# Patient Record
Sex: Female | Born: 1935 | Race: White | Hispanic: No | Smoking: Former smoker
Health system: Southern US, Community
[De-identification: ages and names within clinical notes are randomized; demographics above are authoritative.]

## PROBLEM LIST (undated history)

## (undated) DIAGNOSIS — I509 Heart failure, unspecified: Secondary | ICD-10-CM

## (undated) DIAGNOSIS — I10 Essential (primary) hypertension: Secondary | ICD-10-CM

## (undated) DIAGNOSIS — J449 Chronic obstructive pulmonary disease, unspecified: Secondary | ICD-10-CM

## (undated) HISTORY — PX: CORONARY ARTERY BYPASS GRAFT: SHX141

---

## 1999-11-07 ENCOUNTER — Inpatient Hospital Stay (HOSPITAL_COMMUNITY): Admission: EM | Admit: 1999-11-07 | Discharge: 1999-11-16 | Payer: Self-pay | Admitting: Cardiology

## 1999-11-08 ENCOUNTER — Encounter: Payer: Self-pay | Admitting: Surgery

## 1999-11-09 ENCOUNTER — Encounter: Payer: Self-pay | Admitting: Surgery

## 1999-11-10 ENCOUNTER — Encounter: Payer: Self-pay | Admitting: Surgery

## 1999-11-11 ENCOUNTER — Encounter: Payer: Self-pay | Admitting: Surgery

## 2003-01-05 ENCOUNTER — Ambulatory Visit (HOSPITAL_COMMUNITY): Admission: RE | Admit: 2003-01-05 | Discharge: 2003-01-05 | Payer: Self-pay | Admitting: Family Medicine

## 2003-01-05 ENCOUNTER — Encounter: Payer: Self-pay | Admitting: Family Medicine

## 2014-12-02 DIAGNOSIS — I2510 Atherosclerotic heart disease of native coronary artery without angina pectoris: Secondary | ICD-10-CM | POA: Diagnosis not present

## 2014-12-02 DIAGNOSIS — E039 Hypothyroidism, unspecified: Secondary | ICD-10-CM | POA: Diagnosis not present

## 2014-12-02 DIAGNOSIS — Z1389 Encounter for screening for other disorder: Secondary | ICD-10-CM | POA: Diagnosis not present

## 2014-12-02 DIAGNOSIS — Z0001 Encounter for general adult medical examination with abnormal findings: Secondary | ICD-10-CM | POA: Diagnosis not present

## 2014-12-02 DIAGNOSIS — H6122 Impacted cerumen, left ear: Secondary | ICD-10-CM | POA: Diagnosis not present

## 2014-12-02 DIAGNOSIS — N182 Chronic kidney disease, stage 2 (mild): Secondary | ICD-10-CM | POA: Diagnosis not present

## 2015-11-23 DIAGNOSIS — Z23 Encounter for immunization: Secondary | ICD-10-CM | POA: Diagnosis not present

## 2015-11-23 DIAGNOSIS — I2510 Atherosclerotic heart disease of native coronary artery without angina pectoris: Secondary | ICD-10-CM | POA: Diagnosis not present

## 2015-11-23 DIAGNOSIS — R7309 Other abnormal glucose: Secondary | ICD-10-CM | POA: Diagnosis not present

## 2015-11-23 DIAGNOSIS — I10 Essential (primary) hypertension: Secondary | ICD-10-CM | POA: Diagnosis not present

## 2015-11-23 DIAGNOSIS — Z1389 Encounter for screening for other disorder: Secondary | ICD-10-CM | POA: Diagnosis not present

## 2015-11-23 DIAGNOSIS — E782 Mixed hyperlipidemia: Secondary | ICD-10-CM | POA: Diagnosis not present

## 2015-11-23 DIAGNOSIS — E039 Hypothyroidism, unspecified: Secondary | ICD-10-CM | POA: Diagnosis not present

## 2016-11-21 DIAGNOSIS — I10 Essential (primary) hypertension: Secondary | ICD-10-CM | POA: Diagnosis not present

## 2016-11-21 DIAGNOSIS — E039 Hypothyroidism, unspecified: Secondary | ICD-10-CM | POA: Diagnosis not present

## 2016-11-21 DIAGNOSIS — E782 Mixed hyperlipidemia: Secondary | ICD-10-CM | POA: Diagnosis not present

## 2016-11-21 DIAGNOSIS — E063 Autoimmune thyroiditis: Secondary | ICD-10-CM | POA: Diagnosis not present

## 2016-11-21 DIAGNOSIS — Z1389 Encounter for screening for other disorder: Secondary | ICD-10-CM | POA: Diagnosis not present

## 2017-11-13 DIAGNOSIS — Z1389 Encounter for screening for other disorder: Secondary | ICD-10-CM | POA: Diagnosis not present

## 2017-11-13 DIAGNOSIS — Z0001 Encounter for general adult medical examination with abnormal findings: Secondary | ICD-10-CM | POA: Diagnosis not present

## 2017-11-13 DIAGNOSIS — E782 Mixed hyperlipidemia: Secondary | ICD-10-CM | POA: Diagnosis not present

## 2017-11-13 DIAGNOSIS — I10 Essential (primary) hypertension: Secondary | ICD-10-CM | POA: Diagnosis not present

## 2017-11-13 DIAGNOSIS — E441 Mild protein-calorie malnutrition: Secondary | ICD-10-CM | POA: Diagnosis not present

## 2018-10-15 DIAGNOSIS — Z1389 Encounter for screening for other disorder: Secondary | ICD-10-CM | POA: Diagnosis not present

## 2018-10-15 DIAGNOSIS — N182 Chronic kidney disease, stage 2 (mild): Secondary | ICD-10-CM | POA: Diagnosis not present

## 2018-10-15 DIAGNOSIS — E7849 Other hyperlipidemia: Secondary | ICD-10-CM | POA: Diagnosis not present

## 2018-10-15 DIAGNOSIS — Z0001 Encounter for general adult medical examination with abnormal findings: Secondary | ICD-10-CM | POA: Diagnosis not present

## 2018-10-15 DIAGNOSIS — I10 Essential (primary) hypertension: Secondary | ICD-10-CM | POA: Diagnosis not present

## 2018-10-15 DIAGNOSIS — E039 Hypothyroidism, unspecified: Secondary | ICD-10-CM | POA: Diagnosis not present

## 2019-10-13 DIAGNOSIS — E7849 Other hyperlipidemia: Secondary | ICD-10-CM | POA: Diagnosis not present

## 2019-10-13 DIAGNOSIS — I10 Essential (primary) hypertension: Secondary | ICD-10-CM | POA: Diagnosis not present

## 2019-10-13 DIAGNOSIS — Z0001 Encounter for general adult medical examination with abnormal findings: Secondary | ICD-10-CM | POA: Diagnosis not present

## 2019-10-13 DIAGNOSIS — E039 Hypothyroidism, unspecified: Secondary | ICD-10-CM | POA: Diagnosis not present

## 2019-10-13 DIAGNOSIS — R0602 Shortness of breath: Secondary | ICD-10-CM | POA: Diagnosis not present

## 2019-10-13 DIAGNOSIS — Z0000 Encounter for general adult medical examination without abnormal findings: Secondary | ICD-10-CM | POA: Diagnosis not present

## 2019-10-13 DIAGNOSIS — Z1389 Encounter for screening for other disorder: Secondary | ICD-10-CM | POA: Diagnosis not present

## 2019-10-18 ENCOUNTER — Inpatient Hospital Stay (HOSPITAL_COMMUNITY)
Admission: EM | Admit: 2019-10-18 | Discharge: 2019-10-25 | DRG: 291 | Disposition: A | Discharge status: Home / Self Care | Payer: Medicare Other | Attending: Internal Medicine | Admitting: Internal Medicine

## 2019-10-18 ENCOUNTER — Other Ambulatory Visit: Payer: Self-pay

## 2019-10-18 ENCOUNTER — Emergency Department (HOSPITAL_COMMUNITY): Payer: Medicare Other

## 2019-10-18 ENCOUNTER — Encounter (HOSPITAL_COMMUNITY): Payer: Self-pay | Admitting: Emergency Medicine

## 2019-10-18 DIAGNOSIS — N281 Cyst of kidney, acquired: Secondary | ICD-10-CM | POA: Diagnosis present

## 2019-10-18 DIAGNOSIS — J9811 Atelectasis: Secondary | ICD-10-CM | POA: Diagnosis not present

## 2019-10-18 DIAGNOSIS — G9341 Metabolic encephalopathy: Secondary | ICD-10-CM | POA: Diagnosis not present

## 2019-10-18 DIAGNOSIS — R54 Age-related physical debility: Secondary | ICD-10-CM | POA: Diagnosis not present

## 2019-10-18 DIAGNOSIS — E1165 Type 2 diabetes mellitus with hyperglycemia: Secondary | ICD-10-CM | POA: Diagnosis not present

## 2019-10-18 DIAGNOSIS — R059 Cough, unspecified: Secondary | ICD-10-CM

## 2019-10-18 DIAGNOSIS — R0902 Hypoxemia: Secondary | ICD-10-CM | POA: Diagnosis not present

## 2019-10-18 DIAGNOSIS — I248 Other forms of acute ischemic heart disease: Secondary | ICD-10-CM | POA: Diagnosis present

## 2019-10-18 DIAGNOSIS — R0602 Shortness of breath: Secondary | ICD-10-CM | POA: Diagnosis not present

## 2019-10-18 DIAGNOSIS — R1310 Dysphagia, unspecified: Secondary | ICD-10-CM | POA: Diagnosis present

## 2019-10-18 DIAGNOSIS — J441 Chronic obstructive pulmonary disease with (acute) exacerbation: Secondary | ICD-10-CM | POA: Diagnosis not present

## 2019-10-18 DIAGNOSIS — R05 Cough: Secondary | ICD-10-CM

## 2019-10-18 DIAGNOSIS — I700 Atherosclerosis of aorta: Secondary | ICD-10-CM | POA: Diagnosis not present

## 2019-10-18 DIAGNOSIS — R739 Hyperglycemia, unspecified: Secondary | ICD-10-CM | POA: Diagnosis not present

## 2019-10-18 DIAGNOSIS — R5381 Other malaise: Secondary | ICD-10-CM | POA: Diagnosis present

## 2019-10-18 DIAGNOSIS — Z743 Need for continuous supervision: Secondary | ICD-10-CM | POA: Diagnosis not present

## 2019-10-18 DIAGNOSIS — I110 Hypertensive heart disease with heart failure: Secondary | ICD-10-CM | POA: Diagnosis not present

## 2019-10-18 DIAGNOSIS — Z79890 Hormone replacement therapy (postmenopausal): Secondary | ICD-10-CM | POA: Diagnosis not present

## 2019-10-18 DIAGNOSIS — E039 Hypothyroidism, unspecified: Secondary | ICD-10-CM | POA: Diagnosis present

## 2019-10-18 DIAGNOSIS — Z951 Presence of aortocoronary bypass graft: Secondary | ICD-10-CM

## 2019-10-18 DIAGNOSIS — Z87891 Personal history of nicotine dependence: Secondary | ICD-10-CM

## 2019-10-18 DIAGNOSIS — R069 Unspecified abnormalities of breathing: Secondary | ICD-10-CM | POA: Diagnosis not present

## 2019-10-18 DIAGNOSIS — I5043 Acute on chronic combined systolic (congestive) and diastolic (congestive) heart failure: Secondary | ICD-10-CM | POA: Diagnosis not present

## 2019-10-18 DIAGNOSIS — I5031 Acute diastolic (congestive) heart failure: Secondary | ICD-10-CM | POA: Diagnosis not present

## 2019-10-18 DIAGNOSIS — I509 Heart failure, unspecified: Secondary | ICD-10-CM | POA: Diagnosis not present

## 2019-10-18 DIAGNOSIS — I959 Hypotension, unspecified: Secondary | ICD-10-CM | POA: Diagnosis not present

## 2019-10-18 DIAGNOSIS — Z8249 Family history of ischemic heart disease and other diseases of the circulatory system: Secondary | ICD-10-CM | POA: Diagnosis not present

## 2019-10-18 DIAGNOSIS — E7800 Pure hypercholesterolemia, unspecified: Secondary | ICD-10-CM | POA: Diagnosis not present

## 2019-10-18 DIAGNOSIS — I10 Essential (primary) hypertension: Secondary | ICD-10-CM | POA: Diagnosis not present

## 2019-10-18 DIAGNOSIS — I5041 Acute combined systolic (congestive) and diastolic (congestive) heart failure: Secondary | ICD-10-CM | POA: Diagnosis not present

## 2019-10-18 DIAGNOSIS — E785 Hyperlipidemia, unspecified: Secondary | ICD-10-CM | POA: Diagnosis present

## 2019-10-18 DIAGNOSIS — J9601 Acute respiratory failure with hypoxia: Secondary | ICD-10-CM | POA: Diagnosis not present

## 2019-10-18 DIAGNOSIS — Z880 Allergy status to penicillin: Secondary | ICD-10-CM

## 2019-10-18 DIAGNOSIS — Z20822 Contact with and (suspected) exposure to covid-19: Secondary | ICD-10-CM | POA: Diagnosis not present

## 2019-10-18 DIAGNOSIS — R7303 Prediabetes: Secondary | ICD-10-CM | POA: Diagnosis not present

## 2019-10-18 DIAGNOSIS — R079 Chest pain, unspecified: Secondary | ICD-10-CM | POA: Diagnosis not present

## 2019-10-18 DIAGNOSIS — I2510 Atherosclerotic heart disease of native coronary artery without angina pectoris: Secondary | ICD-10-CM | POA: Diagnosis not present

## 2019-10-18 DIAGNOSIS — R531 Weakness: Secondary | ICD-10-CM | POA: Diagnosis not present

## 2019-10-18 DIAGNOSIS — J9600 Acute respiratory failure, unspecified whether with hypoxia or hypercapnia: Secondary | ICD-10-CM | POA: Diagnosis present

## 2019-10-18 DIAGNOSIS — Z79899 Other long term (current) drug therapy: Secondary | ICD-10-CM

## 2019-10-18 DIAGNOSIS — I340 Nonrheumatic mitral (valve) insufficiency: Secondary | ICD-10-CM | POA: Diagnosis not present

## 2019-10-18 HISTORY — DX: Essential (primary) hypertension: I10

## 2019-10-18 HISTORY — DX: Chronic obstructive pulmonary disease, unspecified: J44.9

## 2019-10-18 HISTORY — DX: Heart failure, unspecified: I50.9

## 2019-10-18 LAB — CBC WITH DIFFERENTIAL/PLATELET
Abs Immature Granulocytes: 0.07 10*3/uL (ref 0.00–0.07)
Basophils Absolute: 0 10*3/uL (ref 0.0–0.1)
Basophils Relative: 0 %
Eosinophils Absolute: 0.4 10*3/uL (ref 0.0–0.5)
Eosinophils Relative: 3 %
HCT: 44.5 % (ref 36.0–46.0)
Hemoglobin: 14 g/dL (ref 12.0–15.0)
Immature Granulocytes: 1 %
Lymphocytes Relative: 3 %
Lymphs Abs: 0.5 10*3/uL — ABNORMAL LOW (ref 0.7–4.0)
MCH: 33.7 pg (ref 26.0–34.0)
MCHC: 31.5 g/dL (ref 30.0–36.0)
MCV: 107 fL — ABNORMAL HIGH (ref 80.0–100.0)
Monocytes Absolute: 1 10*3/uL (ref 0.1–1.0)
Monocytes Relative: 6 %
Neutro Abs: 12.9 10*3/uL — ABNORMAL HIGH (ref 1.7–7.7)
Neutrophils Relative %: 87 %
Platelets: 165 10*3/uL (ref 150–400)
RBC: 4.16 MIL/uL (ref 3.87–5.11)
RDW: 13.4 % (ref 11.5–15.5)
WBC: 14.9 10*3/uL — ABNORMAL HIGH (ref 4.0–10.5)
nRBC: 0 % (ref 0.0–0.2)

## 2019-10-18 LAB — URINALYSIS, ROUTINE W REFLEX MICROSCOPIC
Bacteria, UA: NONE SEEN
Bilirubin Urine: NEGATIVE
Glucose, UA: 150 mg/dL — AB
Ketones, ur: NEGATIVE mg/dL
Leukocytes,Ua: NEGATIVE
Nitrite: NEGATIVE
Protein, ur: 100 mg/dL — AB
Specific Gravity, Urine: 1.012 (ref 1.005–1.030)
pH: 5 (ref 5.0–8.0)

## 2019-10-18 LAB — TROPONIN I (HIGH SENSITIVITY)
Troponin I (High Sensitivity): 43 ng/L — ABNORMAL HIGH (ref <18)
Troponin I (High Sensitivity): 118 ng/L (ref <18)
Troponin I (High Sensitivity): 219 ng/L (ref <18)
Troponin I (High Sensitivity): 331 ng/L (ref <18)
Troponin I (High Sensitivity): 410 ng/L (ref <18)

## 2019-10-18 LAB — COMPREHENSIVE METABOLIC PANEL
ALT: 28 U/L (ref 0–44)
AST: 36 U/L (ref 15–41)
Albumin: 4.2 g/dL (ref 3.5–5.0)
Alkaline Phosphatase: 85 U/L (ref 38–126)
Anion gap: 11 (ref 5–15)
BUN: 14 mg/dL (ref 8–23)
CO2: 26 mmol/L (ref 22–32)
Calcium: 8.3 mg/dL — ABNORMAL LOW (ref 8.9–10.3)
Chloride: 99 mmol/L (ref 98–111)
Creatinine, Ser: 0.86 mg/dL (ref 0.44–1.00)
GFR calc Af Amer: >60 mL/min (ref >60)
GFR calc non Af Amer: >60 mL/min (ref >60)
Glucose, Bld: 213 mg/dL — ABNORMAL HIGH (ref 70–99)
Potassium: 4.1 mmol/L (ref 3.5–5.1)
Sodium: 136 mmol/L (ref 135–145)
Total Bilirubin: 0.8 mg/dL (ref 0.3–1.2)
Total Protein: 6.8 g/dL (ref 6.5–8.1)

## 2019-10-18 LAB — RESPIRATORY PANEL BY RT PCR (FLU A&B, COVID)
Influenza A by PCR: NEGATIVE
Influenza B by PCR: NEGATIVE
SARS Coronavirus 2 by RT PCR: NEGATIVE

## 2019-10-18 LAB — BRAIN NATRIURETIC PEPTIDE: B Natriuretic Peptide: 1792 pg/mL — ABNORMAL HIGH (ref 0.0–100.0)

## 2019-10-18 LAB — TSH: TSH: 2.079 u[IU]/mL (ref 0.350–4.500)

## 2019-10-18 LAB — BLOOD GAS, ARTERIAL
Acid-Base Excess: 2.3 mmol/L — ABNORMAL HIGH (ref 0.0–2.0)
Bicarbonate: 26 mmol/L (ref 20.0–28.0)
FIO2: 28
O2 Saturation: 94.9 %
Patient temperature: 36.4
pCO2 arterial: 45.4 mmHg (ref 32.0–48.0)
pH, Arterial: 7.389 (ref 7.350–7.450)
pO2, Arterial: 78.2 mmHg — ABNORMAL LOW (ref 83.0–108.0)

## 2019-10-18 MED ORDER — ACETAMINOPHEN 325 MG PO TABS: 650.0000 mg | ORAL_TABLET | Freq: Four times a day (QID) | ORAL | Status: DC | PRN

## 2019-10-18 MED ORDER — BENAZEPRIL HCL 20 MG PO TABS
20.0000 mg | ORAL_TABLET | Freq: Every day | ORAL | Status: DC
  Administered 2019-10-19 – 2019-10-20 (×2): 20 mg via ORAL
  Filled 2019-10-18 (×2): qty 1

## 2019-10-18 MED ORDER — POLYETHYLENE GLYCOL 3350 17 G PO PACK: 17.0000 g | PACK | Freq: Every day | ORAL | Status: DC | PRN

## 2019-10-18 MED ORDER — BENAZEPRIL HCL 10 MG PO TABS
20.0000 mg | ORAL_TABLET | Freq: Every day | ORAL | Status: DC
  Administered 2019-10-18: 20 mg via ORAL
  Filled 2019-10-18 (×3): qty 2

## 2019-10-18 MED ORDER — ENOXAPARIN SODIUM 40 MG/0.4ML ~~LOC~~ SOLN: 40.0000 mg | SUBCUTANEOUS | Status: DC

## 2019-10-18 MED ORDER — ASPIRIN EC 81 MG PO TBEC
81.0000 mg | DELAYED_RELEASE_TABLET | Freq: Every day | ORAL | Status: DC
  Administered 2019-10-19 – 2019-10-25 (×7): 81 mg via ORAL
  Filled 2019-10-18 (×7): qty 1

## 2019-10-18 MED ORDER — FUROSEMIDE 10 MG/ML IJ SOLN
40.0000 mg | Freq: Once | INTRAMUSCULAR | Status: AC
  Administered 2019-10-18: 40 mg via INTRAVENOUS
  Filled 2019-10-18: qty 4

## 2019-10-18 MED ORDER — SODIUM CHLORIDE 0.9 % IV SOLN
3.0000 g | Freq: Three times a day (TID) | INTRAVENOUS | Status: DC
  Administered 2019-10-18 – 2019-10-19 (×3): 3 g via INTRAVENOUS
  Filled 2019-10-18: qty 8
  Filled 2019-10-18: qty 3
  Filled 2019-10-18 (×2): qty 8

## 2019-10-18 MED ORDER — ACETAMINOPHEN 650 MG RE SUPP: 650.0000 mg | Freq: Four times a day (QID) | RECTAL | Status: DC | PRN

## 2019-10-18 MED ORDER — METOPROLOL TARTRATE 25 MG PO TABS
25.0000 mg | ORAL_TABLET | Freq: Once | ORAL | Status: AC
  Administered 2019-10-18: 25 mg via ORAL
  Filled 2019-10-18: qty 1

## 2019-10-18 MED ORDER — LABETALOL HCL 5 MG/ML IV SOLN: 5.0000 mg | INTRAVENOUS | Status: DC | PRN

## 2019-10-18 MED ORDER — ENOXAPARIN SODIUM 30 MG/0.3ML ~~LOC~~ SOLN
30.0000 mg | SUBCUTANEOUS | Status: DC
  Administered 2019-10-18 – 2019-10-24 (×7): 30 mg via SUBCUTANEOUS
  Filled 2019-10-18 (×7): qty 0.3

## 2019-10-18 MED ORDER — ONDANSETRON HCL 4 MG PO TABS: 4.0000 mg | ORAL_TABLET | Freq: Four times a day (QID) | ORAL | Status: DC | PRN

## 2019-10-18 MED ORDER — ONDANSETRON HCL 4 MG/2ML IJ SOLN: 4.0000 mg | Freq: Four times a day (QID) | INTRAMUSCULAR | Status: DC | PRN

## 2019-10-18 MED ORDER — IPRATROPIUM-ALBUTEROL 0.5-2.5 (3) MG/3ML IN SOLN
3.0000 mL | Freq: Four times a day (QID) | RESPIRATORY_TRACT | Status: AC
  Administered 2019-10-18: 3 mL via RESPIRATORY_TRACT
  Filled 2019-10-18: qty 3

## 2019-10-18 MED ORDER — METOPROLOL TARTRATE 25 MG PO TABS
25.0000 mg | ORAL_TABLET | Freq: Two times a day (BID) | ORAL | Status: DC
  Administered 2019-10-19 – 2019-10-20 (×3): 25 mg via ORAL
  Filled 2019-10-18 (×3): qty 1

## 2019-10-18 MED ORDER — IOHEXOL 350 MG/ML SOLN
100.0000 mL | Freq: Once | INTRAVENOUS | Status: AC | PRN
  Administered 2019-10-18: 100 mL via INTRAVENOUS

## 2019-10-18 MED ORDER — ASPIRIN 81 MG PO CHEW
324.0000 mg | CHEWABLE_TABLET | Freq: Once | ORAL | Status: AC
  Administered 2019-10-18: 324 mg via ORAL
  Filled 2019-10-18: qty 4

## 2019-10-18 NOTE — ED Provider Notes (Signed)
[facility] EMERGENCY DEPARTMENT Provider Note   CSN: [id number] Arrival date & time: 10/18/19  1007     History Chief Complaint  Patient presents with  . Shortness of Breath    Chelsea Goodman is a 86 y.o. female.  Patient with acute onset of difficulty breathing this morning is very short of breath at home.  EMS noted that room air sats were 84%.  Patient also seemed a little confused.  EMS placed her on nonrebreather and oxygen levels went up to 100%.  Blood sugars were 384 at home.  Patient's past medical history she used to be a smoker not known to have COPD known to have hypertension.  Has had coronary artery bypass graft surgery in the past.  Not known to have congestive heart failure.  No leg swelling.  Patient has had both Covid vaccines.  Had the second 1 way back in early February.  Patient denies any chest pain.  Patient switched over to 2 L nasal cannula oxygen here.  Oxygen sats in the low 90s and patient comfortable.        Past Medical History:  Diagnosis Date  . Hypertension     There are no problems to display for this patient.   Past Surgical History:  Procedure Laterality Date  . CORONARY ARTERY BYPASS GRAFT       OB History   No obstetric history on file.     No family history on file.  Social History   Tobacco Use  . Smoking status: Former Games developer  . Smokeless tobacco: Never Used  Substance Use Topics  . Alcohol use: Not Currently  . Drug use: Not Currently    Home Medications Prior to Admission medications   Medication Sig Start Date End Date Taking? Authorizing Provider  benazepril (LOTENSIN) 20 MG tablet Take 20 mg by mouth daily. 10/13/19   [provider]  levothyroxine (SYNTHROID) 75 MCG tablet Take 75 mcg by mouth daily. 08/23/19   [provider]  metoprolol tartrate (LOPRESSOR) 25 MG tablet Take 25 mg by mouth 2 (two) times daily. 08/09/19   [provider]  simvastatin (ZOCOR) 20 MG tablet Take 20 mg by  mouth daily. 10/13/19   [provider]    Allergies    Patient has no known allergies.  Review of Systems   Review of Systems  Constitutional: Negative for chills and fever.  HENT: Negative for congestion, rhinorrhea and sore throat.   Eyes: Negative for visual disturbance.  Respiratory: Positive for shortness of breath. Negative for cough.   Cardiovascular: Negative for chest pain and leg swelling.  Gastrointestinal: Negative for abdominal pain, diarrhea, nausea and vomiting.  Genitourinary: Negative for dysuria.  Musculoskeletal: Negative for back pain and neck pain.  Skin: Negative for rash.  Neurological: Negative for dizziness, light-headedness and headaches.  Hematological: Does not bruise/bleed easily.  Psychiatric/Behavioral: Negative for confusion.    Physical Exam Updated Vital Signs BP 136/76   Pulse 72   Temp 97.6 F (36.4 C) (Oral)   Resp (!) 29   Ht 1.524 m (5')   Wt 52.2 kg   SpO2 97%   BMI 22.46 kg/m   Physical Exam Vitals and nursing note reviewed.  Constitutional:      General: She is not in acute distress.    Appearance: Normal appearance. She is well-developed.  HENT:     Head: Normocephalic and atraumatic.  Eyes:     Extraocular Movements: Extraocular movements intact.  Conjunctiva/sclera: Conjunctivae normal.     Pupils: Pupils are equal, round, and reactive to light.  Cardiovascular:     Rate and Rhythm: Normal rate and regular rhythm.     Heart sounds: No murmur.  Pulmonary:     Effort: Pulmonary effort is normal. No respiratory distress.     Breath sounds: Normal breath sounds. No wheezing.  Abdominal:     Palpations: Abdomen is soft.     Tenderness: There is no abdominal tenderness.  Musculoskeletal:        General: No swelling. Normal range of motion.     Cervical back: Normal range of motion and neck supple.  Skin:    General: Skin is warm and dry.     Capillary Refill: Capillary refill takes less than 2 seconds.   Neurological:     General: No focal deficit present.     Mental Status: She is alert and oriented to person, place, and time.     ED Results / Procedures / Treatments   Labs (all labs ordered are listed, but only abnormal results are displayed) Labs Reviewed  COMPREHENSIVE METABOLIC PANEL - Abnormal; Notable for the following components:      Result Value   Glucose, Bld 213 (*)    Calcium 8.3 (*)    All other components within normal limits  CBC WITH DIFFERENTIAL/PLATELET - Abnormal; Notable for the following components:   WBC 14.9 (*)    MCV 107.0 (*)    Neutro Abs 12.9 (*)    Lymphs Abs 0.5 (*)    All other components within normal limits  BRAIN NATRIURETIC PEPTIDE - Abnormal; Notable for the following components:   B Natriuretic Peptide 1,792.0 (*)    All other components within normal limits  URINALYSIS, ROUTINE W REFLEX MICROSCOPIC - Abnormal; Notable for the following components:   Glucose, UA 150 (*)    Hgb urine dipstick MODERATE (*)    Protein, ur 100 (*)    All other components within normal limits  BLOOD GAS, ARTERIAL - Abnormal; Notable for the following components:   pO2, Arterial 78.2 (*)    Acid-Base Excess 2.3 (*)    Allens test (pass/fail) RIGHT BRACHIAL (*)    All other components within normal limits  TROPONIN I (HIGH SENSITIVITY) - Abnormal; Notable for the following components:   Troponin I (High Sensitivity) 43 (*)    All other components within normal limits  TROPONIN I (HIGH SENSITIVITY) - Abnormal; Notable for the following components:   Troponin I (High Sensitivity) 118 (*)    All other components within normal limits  RESPIRATORY PANEL BY RT PCR (FLU A&B, COVID)  TROPONIN I (HIGH SENSITIVITY)    EKG EKG Interpretation  Date/Time:  Saturday Oct 18 2019 10:25:20 EDT Ventricular Rate:  90 PR Interval:    QRS Duration: 114 QT Interval:  371 QTC Calculation: 457 R Axis:   15 Text Interpretation: Sinus rhythm Borderline prolonged PR  interval LVH with IVCD and secondary repol abnrm Borderline ST elevation, inferior leads Interpretation limited secondary to artifact Confirmed by Vanetta Mulders [phone]) on 10/18/2019 10:40:39 AM   Radiology DG Chest Port 1 View  Result Date: 10/18/2019 CLINICAL DATA:  Shortness of breath EXAM: PORTABLE CHEST 1 VIEW COMPARISON:  None. FINDINGS: Probable background COPD with likely chronic interstitial prominence. Small left pleural effusion with left basilar atelectasis. Cardiomediastinal contours are within normal limits normal heart size and evidence of CABG. There is calcified plaque along the aortic arch. IMPRESSION: Probable COPD with  likely chronic interstitial prominence. Superimposed acute interstitial process is difficult to exclude. Small left pleural effusion with left basilar atelectasis. Electronically Signed   By: Macy Mis M.D.   On: 10/18/2019 11:47    Procedures Procedures (including critical care time)  CRITICAL CARE Performed by: Fredia Sorrow Total critical care time: 35 minutes Critical care time was exclusive of separately billable procedures and treating other patients. Critical care was necessary to treat or prevent imminent or life-threatening deterioration. Critical care was time spent personally by me on the following activities: development of treatment plan with patient and/or surrogate as well as nursing, discussions with consultants, evaluation of patient's response to treatment, examination of patient, obtaining history from patient or surrogate, ordering and performing treatments and interventions, ordering and review of laboratory studies, ordering and review of radiographic studies, pulse oximetry and re-evaluation of patient's condition.   Medications Ordered in ED Medications  furosemide (LASIX) injection 40 mg (40 mg Intravenous Given 10/18/19 1307)  iohexol (OMNIPAQUE) 350 MG/ML injection 100 mL (100 mLs Intravenous Contrast Given 10/18/19 1431)     ED Course  I have reviewed the triage vital signs and the nursing notes.  Pertinent labs & imaging results that were available during my care of the patient were reviewed by me and considered in my medical decision making (see chart for details).    MDM Rules/Calculators/A&P                     Work-up here approached on possible COPD possible cardiac cause for the shortness of breath.  Patient with new onset oxygen requirement.  Blood gas without a significant abnormality in pH or PCO2.  Patient's initial troponin was 43.  Repeat was 118.  This prompted discussion with cardiology.  Chest x-ray without necessarily any acute findings.  But patient's BNP was markedly elevated at 1700.  Cardiology recommends admission down to [facility] where they can consult.  By the hospitalist service.  Patient without any chest pain.  CT angio was ordered just for completeness sake when we are trying to sort out the hypoxia.  But results are not back yet.  Cardiology did not recommend heparin based on the troponins that we have so far.  Patient's family informed.  Patient was given 40 mg of Lasix IV.  Also gave her normal daily blood pressure medicine.  Which included Lotensin and Lopressor.  Because she had not had any of that today.  Patient given baby aspirin.  Covid test was negative.  Final Clinical Impression(s) / ED Diagnoses Final diagnoses:  Hypoxia  SOB (shortness of breath)  Acute congestive heart failure, unspecified heart failure type [facility])    Rx / DC Orders ED Discharge Orders    None       Fredia Sorrow, MD 10/18/19 1529

## 2019-10-18 NOTE — H&P (Addendum)
History and Physical    Chelsea Goodman MVH:[id number] DOB: 1934-03-18 DOA: 10/18/2019  PCP: Patient, No Pcp Per   Patient coming from: Home  I have personally briefly reviewed patient's old medical records in [facility]  Chief Complaint: Difficulty breathing.  HPI: Chelsea Goodman is a 86 y.o. female with medical history significant for CABG, hypertension.  Brought to the ED via EMS for reports of difficulty breathing, on EMS arrival patient was walking around the house confused, O2 sats 84% on room air.  Was placed on nasal cannula and O2 sats improved to 100%. At the time of my evaluation patient is awake alert and oriented.  She does not remember the episode of confusion. She has been having difficulty breathing ongoing over the past week. Patient has a remote history of smoking, over 40 years ago.  But she over the past 20 years she has been using a wood stove at home.  Over the past month the [employer] has being malfunctioning, patient's family has noticed the house smelling more of smoke, but this significantly worsened over the past 2 weeks, with smoke lingering in the air.  Patient's daughter had rattling in patient's chest over the past 2 weeks.  Patient is unaware of rattling or wheezing.  She has a chronic unchanged cough. No chest pain, no leg swelling, no weight gain, no bloating. Patient reports recently she has been having some difficulty swallowing especially her pills.  ED Course: Blood pressure elevated initially 200 systolic, improved to 130s with resuming home antihypertensives, temperature 97.2, tachypneic.  WBC 14.9.  BNP markedly elevated at 1792.  Troponin 43 >> 118.  UA moderate hemoglobin not suggestive of UTI.  pH 7.38, PCO2 45, PO2 78. Chest x-ray shows chronic findings suggestive of COPD.  CTA chest shows small pleural effusions, basilar atelectasis.  Small amount of aspirate material in bilateral proximal bronchi. EDP talked to cardiology recommended admission to  [facility], so that cardiology can see in consult.  IV Lasix 40 mg x 1 given.  Hospitalist admit for acute respiratory failure.  Review of Systems: As per HPI all other systems reviewed and negative.  Past Medical History:  Diagnosis Date  . Hypertension     Past Surgical History:  Procedure Laterality Date  . CORONARY ARTERY BYPASS GRAFT       reports that she has quit smoking. She has never used smokeless tobacco. She reports previous alcohol use. She reports previous drug use.  No Known Allergies  Family history of obesity.  Prior to Admission medications   Medication Sig Start Date End Date Taking? Authorizing Provider  benazepril (LOTENSIN) 20 MG tablet Take 20 mg by mouth daily. 10/13/19   [provider]  levothyroxine (SYNTHROID) 75 MCG tablet Take 75 mcg by mouth daily. 08/23/19   [provider]  metoprolol tartrate (LOPRESSOR) 25 MG tablet Take 25 mg by mouth 2 (two) times daily. 08/09/19   [provider]  simvastatin (ZOCOR) 20 MG tablet Take 20 mg by mouth daily. 10/13/19   [provider]    Physical Exam: Vitals:   10/18/19 1245 10/18/19 1300 10/18/19 1315 10/18/19 1330  BP:  (!) 152/67  136/76  Pulse: 80 71 79 72  Resp: (!) 24 (!) 28 (!) 31 (!) 29  Temp:      TempSrc:      SpO2: 94% 95% 94% 97%  Weight:      Height:        Constitutional: NAD, calm,  comfortable Vitals:   10/18/19 1245 10/18/19 1300 10/18/19 1315 10/18/19 1330  BP:  (!) 152/67  136/76  Pulse: 80 71 79 72  Resp: (!) 24 (!) 28 (!) 31 (!) 29  Temp:      TempSrc:      SpO2: 94% 95% 94% 97%  Weight:      Height:       Eyes: PERRL, lids and conjunctivae normal ENMT: Mucous membranes are dry. Posterior pharynx clear of any exudate or lesions.  Neck: normal, supple, no masses, no thyromegaly Respiratory: Coarse breath sounds, R worse than left, no wheezing, faint bibasilar crackles. Normal respiratory effort. No accessory muscle use.  Cardiovascular:  Regular rate and rhythm, no murmurs / rubs / gallops. No extremity edema. 2+ pedal pulses.   Abdomen: no tenderness, no masses palpated. No hepatosplenomegaly. Musculoskeletal: no clubbing / cyanosis. No joint deformity upper and lower extremities. Good ROM, no contractures. Normal muscle tone.  Skin: no rashes, lesions, ulcers. No induration Neurologic: No apparent cranial nerve abnormality, moving all extremities spontaneously. Psychiatric: Normal judgment and insight. Alert and oriented x 3. Normal mood.   Labs on Admission: I have personally reviewed following labs and imaging studies  CBC: Recent Labs  Lab 10/18/19 1112  WBC 14.9*  NEUTROABS 12.9*  HGB 14.0  HCT 44.5  MCV 107.0*  PLT 403   Basic Metabolic Panel: Recent Labs  Lab 10/18/19 1112  NA 136  K 4.1  CL 99  CO2 26  GLUCOSE 213*  BUN 14  CREATININE 0.86  CALCIUM 8.3*   Liver Function Tests: Recent Labs  Lab 10/18/19 1112  AST 36  ALT 28  ALKPHOS 85  BILITOT 0.8  PROT 6.8  ALBUMIN 4.2   Urine analysis:    Component Value Date/Time   COLORURINE YELLOW 10/18/2019 1229   APPEARANCEUR CLEAR 10/18/2019 1229   LABSPEC 1.012 10/18/2019 1229   PHURINE 5.0 10/18/2019 1229   GLUCOSEU 150 (A) 10/18/2019 1229   HGBUR MODERATE (A) 10/18/2019 [place] 10/18/2019 [place] 10/18/2019 1229   PROTEINUR 100 (A) 10/18/2019 1229   NITRITE NEGATIVE 10/18/2019 1229   LEUKOCYTESUR NEGATIVE 10/18/2019 1229    Radiological Exams on Admission: CT Angio Chest PE W/Cm &/Or Wo Cm  Result Date: 10/18/2019 CLINICAL DATA:  Shortness of breath with hypoxia. Possible pulmonary embolism. EXAM: CT ANGIOGRAPHY CHEST WITH CONTRAST TECHNIQUE: Multidetector CT imaging of the chest was performed using the standard protocol during bolus administration of intravenous contrast. Multiplanar CT image reconstructions and MIPs were obtained to evaluate the vascular anatomy. CONTRAST:  180mL OMNIPAQUE  IOHEXOL 350 MG/ML SOLN COMPARISON:  Chest x-ray today. FINDINGS: Cardiovascular: Mild-to-moderate cardiomegaly. Moderate calcified plaque over the left main and 3 vessel coronary arteries. No evidence of thoracic aortic aneurysm. There is calcified plaque throughout the thoracic aorta. Pulmonary arterial system is well opacified without evidence of emboli. Remaining vascular structures are unremarkable. Mediastinum/Nodes: No mediastinal or hilar adenopathy. Remaining mediastinal structures are unremarkable. Lungs/Pleura: Lungs are adequately inflated with mild to moderate centrilobular emphysematous disease. No focal lobar consolidation. Small bilateral pleural effusions with associated basilar atelectasis. 3 mm sub solid nodule over the posterior left upper lobe. Small amount of aspirate material within the proximal airways bilaterally. Upper Abdomen: Upper pole right renal cyst. Mild prominence of the intrarenal collecting systems bilaterally. Moderate to severe calcified plaque over the abdominal aorta. Musculoskeletal: Minimal degenerative change of the spine. Review of the MIP images confirms the above findings. IMPRESSION: 1.  No evidence of pulmonary embolism. 2. Small bilateral pleural effusions with associated bibasilar atelectasis. Small amount of aspirate material over the proximal bronchi bilaterally. 3. Aortic Atherosclerosis (ICD10-I70.0) and Emphysema (ICD10-J43.9). Atherosclerotic coronary artery disease. 4.  Cardiomegaly. 5. Right renal cyst. Mild prominence of the intrarenal collecting systems bilaterally. Electronically Signed   By: Elberta Fortis M.D.   On: 10/18/2019 15:20   DG Chest Port 1 View  Result Date: 10/18/2019 CLINICAL DATA:  Shortness of breath EXAM: PORTABLE CHEST 1 VIEW COMPARISON:  None. FINDINGS: Probable background COPD with likely chronic interstitial prominence. Small left pleural effusion with left basilar atelectasis. Cardiomediastinal contours are within normal limits  normal heart size and evidence of CABG. There is calcified plaque along the aortic arch. IMPRESSION: Probable COPD with likely chronic interstitial prominence. Superimposed acute interstitial process is difficult to exclude. Small left pleural effusion with left basilar atelectasis. Electronically Signed   By: Guadlupe Spanish M.D.   On: 10/18/2019 11:47    EKG: Independently reviewed.  Sinus rhythm, TC 457.  LVH.  T wave inversions in V5 and V6, abnormalities also in lead I and aVL.  No old EKG to compare.  Assessment/Plan Active Problems:   Acute respiratory failure (HCC)     Acute hypoxic respiratory failure- sats 84% on room air currently on 2 L nasal cannula with sats > 94%.  Despite marked elevated BNP at 1792, she does not appear volume overloaded. CTA chest -no pulmonary embolism, aspirate material in bronchi.  No wheezing/rhonchi on lung exam, chronic unchanged cough.  Hypoxia likely secondary to chronic lung damage/COPD as suggested on chest x-ray from chronic use of wood stove indoors, likely also aspirate material in bronchi contributing to hypoxia. -Supplemental O2, DuoNebs scheduled and as needed -May need home O2 on discharge - IV lasix 40mg  x 1 given in ED, hold off on further diuretics. -IV Unasyn -Swallow evaluation  Elevated troponin, history of CABG- 43 > 118 > 219.  Denies chest pain.  Dyspnea likely respiratory issue.  EKG shows T wave abnormalities V5 V6, 1 aVL.  No old EKG to compare. Hx of CABG ~ 20 yrs ago. - obtain echocardiogram - Aspirin 325 given, continue 81mg  daily -Trend troponin  - EKG a.m - EDP talked to cardiology, recommended admission to for cardiology evaluation, not present at [facility] over the weekend.  Metabolic encephalopathy-likely due to hypoxia.  Currently resolved.  UA not suggestive of infection. -Supplemental O2  Leukocytosis- 14.9, afebrile. Leukocytosis probably related to CTA chest findings of aspirate material in proximal  bronchi bilaterally, versus stress reaction.  UA not suggestive of infection.  -Prophylactically with treat with IV Unasyn pharmacy to dose -CBC a.m.  Hypertension- Systolic 130s- 200s. -With contrast exposure we will hold benazepril for now, resume metoprolol - PRN labetalol 5mg  Systolic > 170.  Hyperglycemia- glucose 213. - Hgba1c.   DVT prophylaxis: Lovenox Code Status: DO NOT INTUBATE.  Confirmed with patient and daughter, Chelsea Goodman (who is HCPOA), at bedside. Family Communication: Daughter Chelsea Goodman, present at bedside is healthcare power of attorney. Disposition Plan: 1 to 2 days pending improvement in respiratory status, cardiology evaluation. Consults called: Cardiology Admission status: Observation, telemetry   MD [facility]  10/18/2019, 3:37 PM

## 2019-10-18 NOTE — Progress Notes (Signed)
Pharmacy Antibiotic Note  Chelsea Goodman is a 86 y.o. female admitted on 10/18/2019 with aspiration pneumonia.  Pharmacy has been consulted for unasyn dosing.  Plan: Unasyn 3gm IV q8h F/U cxs and clinical progress Monitor V/S, labs  Height: 5' (152.4 cm) Weight: 52.2 kg (115 lb) IBW/kg (Calculated) : 45.5  Temp (24hrs), Avg:97.4 F (36.3 C), Min:97.2 F (36.2 C), Max:97.6 F (36.4 C)  Recent Labs  Lab 10/18/19 1112  WBC 14.9*  CREATININE 0.86    Estimated Creatinine Clearance: 35 mL/min (by C-G formula based on SCr of 0.86 mg/dL).    No Known Allergies  Antimicrobials this admission: Unasyn 5/1 >>   Dose adjustments this admission: prn  Microbiology results:  BCx:   MRSA PCR:   Thank you for allowing pharmacy to be a part of this patient's care.  Elder Cyphers, BS Pharm D, [place] Clinical Pharmacist Pager [phone] 10/18/2019 5:15 PM

## 2019-10-18 NOTE — Progress Notes (Signed)
CRITICAL VALUE ALERT  Critical Value:  Troponin 410  Date & Time Notied:  10/18/19 @ 2240  Provider Notified: NP Bodenhiemer  Orders Received/Actions taken: awaiting

## 2019-10-18 NOTE — ED Notes (Signed)
Date and time results received: 10/18/19 2:09 PM    Test: trop Critical Value: 118   Name of Provider Notified: Dr Jodelle Gross  Orders Received? Or Actions Taken?:

## 2019-10-18 NOTE — ED Notes (Signed)
Attempt to give report x1.

## 2019-10-18 NOTE — ED Triage Notes (Signed)
Ems called out for difficulty breathing.  Upon arrival to pt's home pt was walking around house confused.  sats 84%  On room air.  Placed on NR sats came up to 100%.  cbg 384.

## 2019-10-19 ENCOUNTER — Encounter (HOSPITAL_COMMUNITY): Payer: Self-pay | Admitting: Internal Medicine

## 2019-10-19 ENCOUNTER — Observation Stay (HOSPITAL_COMMUNITY): Payer: Medicare Other

## 2019-10-19 DIAGNOSIS — I340 Nonrheumatic mitral (valve) insufficiency: Secondary | ICD-10-CM | POA: Diagnosis not present

## 2019-10-19 DIAGNOSIS — I110 Hypertensive heart disease with heart failure: Secondary | ICD-10-CM | POA: Diagnosis not present

## 2019-10-19 DIAGNOSIS — R1310 Dysphagia, unspecified: Secondary | ICD-10-CM | POA: Diagnosis present

## 2019-10-19 DIAGNOSIS — J9811 Atelectasis: Secondary | ICD-10-CM | POA: Diagnosis present

## 2019-10-19 DIAGNOSIS — G9341 Metabolic encephalopathy: Secondary | ICD-10-CM | POA: Diagnosis not present

## 2019-10-19 DIAGNOSIS — R05 Cough: Secondary | ICD-10-CM | POA: Diagnosis not present

## 2019-10-19 DIAGNOSIS — Z20822 Contact with and (suspected) exposure to covid-19: Secondary | ICD-10-CM | POA: Diagnosis not present

## 2019-10-19 DIAGNOSIS — R739 Hyperglycemia, unspecified: Secondary | ICD-10-CM | POA: Diagnosis present

## 2019-10-19 DIAGNOSIS — Z87891 Personal history of nicotine dependence: Secondary | ICD-10-CM | POA: Diagnosis not present

## 2019-10-19 DIAGNOSIS — E785 Hyperlipidemia, unspecified: Secondary | ICD-10-CM | POA: Diagnosis present

## 2019-10-19 DIAGNOSIS — E039 Hypothyroidism, unspecified: Secondary | ICD-10-CM | POA: Diagnosis present

## 2019-10-19 DIAGNOSIS — I5043 Acute on chronic combined systolic (congestive) and diastolic (congestive) heart failure: Secondary | ICD-10-CM | POA: Diagnosis not present

## 2019-10-19 DIAGNOSIS — I5041 Acute combined systolic (congestive) and diastolic (congestive) heart failure: Secondary | ICD-10-CM | POA: Diagnosis not present

## 2019-10-19 DIAGNOSIS — R0902 Hypoxemia: Secondary | ICD-10-CM | POA: Diagnosis not present

## 2019-10-19 DIAGNOSIS — E7800 Pure hypercholesterolemia, unspecified: Secondary | ICD-10-CM | POA: Diagnosis not present

## 2019-10-19 DIAGNOSIS — I700 Atherosclerosis of aorta: Secondary | ICD-10-CM | POA: Diagnosis present

## 2019-10-19 DIAGNOSIS — R079 Chest pain, unspecified: Secondary | ICD-10-CM | POA: Diagnosis not present

## 2019-10-19 DIAGNOSIS — I2510 Atherosclerotic heart disease of native coronary artery without angina pectoris: Secondary | ICD-10-CM | POA: Diagnosis not present

## 2019-10-19 DIAGNOSIS — R0602 Shortness of breath: Secondary | ICD-10-CM | POA: Diagnosis not present

## 2019-10-19 DIAGNOSIS — N281 Cyst of kidney, acquired: Secondary | ICD-10-CM | POA: Diagnosis present

## 2019-10-19 DIAGNOSIS — J9600 Acute respiratory failure, unspecified whether with hypoxia or hypercapnia: Secondary | ICD-10-CM | POA: Diagnosis present

## 2019-10-19 DIAGNOSIS — Z8249 Family history of ischemic heart disease and other diseases of the circulatory system: Secondary | ICD-10-CM | POA: Diagnosis not present

## 2019-10-19 DIAGNOSIS — R531 Weakness: Secondary | ICD-10-CM | POA: Diagnosis not present

## 2019-10-19 DIAGNOSIS — Z880 Allergy status to penicillin: Secondary | ICD-10-CM | POA: Diagnosis not present

## 2019-10-19 DIAGNOSIS — Z79890 Hormone replacement therapy (postmenopausal): Secondary | ICD-10-CM | POA: Diagnosis not present

## 2019-10-19 DIAGNOSIS — I959 Hypotension, unspecified: Secondary | ICD-10-CM | POA: Diagnosis not present

## 2019-10-19 DIAGNOSIS — I5031 Acute diastolic (congestive) heart failure: Secondary | ICD-10-CM | POA: Diagnosis not present

## 2019-10-19 DIAGNOSIS — Z951 Presence of aortocoronary bypass graft: Secondary | ICD-10-CM | POA: Diagnosis not present

## 2019-10-19 DIAGNOSIS — I10 Essential (primary) hypertension: Secondary | ICD-10-CM | POA: Diagnosis not present

## 2019-10-19 DIAGNOSIS — R7303 Prediabetes: Secondary | ICD-10-CM | POA: Diagnosis present

## 2019-10-19 DIAGNOSIS — J9601 Acute respiratory failure with hypoxia: Secondary | ICD-10-CM | POA: Diagnosis not present

## 2019-10-19 DIAGNOSIS — I248 Other forms of acute ischemic heart disease: Secondary | ICD-10-CM | POA: Diagnosis present

## 2019-10-19 DIAGNOSIS — R5381 Other malaise: Secondary | ICD-10-CM | POA: Diagnosis not present

## 2019-10-19 DIAGNOSIS — Z79899 Other long term (current) drug therapy: Secondary | ICD-10-CM | POA: Diagnosis not present

## 2019-10-19 DIAGNOSIS — I509 Heart failure, unspecified: Secondary | ICD-10-CM | POA: Diagnosis not present

## 2019-10-19 DIAGNOSIS — J441 Chronic obstructive pulmonary disease with (acute) exacerbation: Secondary | ICD-10-CM | POA: Diagnosis not present

## 2019-10-19 DIAGNOSIS — R54 Age-related physical debility: Secondary | ICD-10-CM | POA: Diagnosis not present

## 2019-10-19 LAB — BASIC METABOLIC PANEL
Anion gap: 12 (ref 5–15)
BUN: 14 mg/dL (ref 8–23)
CO2: 28 mmol/L (ref 22–32)
Calcium: 8.9 mg/dL (ref 8.9–10.3)
Chloride: 98 mmol/L (ref 98–111)
Creatinine, Ser: 1.1 mg/dL — ABNORMAL HIGH (ref 0.44–1.00)
GFR calc Af Amer: 53 mL/min — ABNORMAL LOW (ref >60)
GFR calc non Af Amer: 46 mL/min — ABNORMAL LOW (ref >60)
Glucose, Bld: 91 mg/dL (ref 70–99)
Potassium: 3.8 mmol/L (ref 3.5–5.1)
Sodium: 138 mmol/L (ref 135–145)

## 2019-10-19 LAB — ECHOCARDIOGRAM COMPLETE
Height: 60 in
Weight: 1287.49 oz

## 2019-10-19 LAB — CBC
HCT: 40.6 % (ref 36.0–46.0)
Hemoglobin: 13.5 g/dL (ref 12.0–15.0)
MCH: 33 pg (ref 26.0–34.0)
MCHC: 33.3 g/dL (ref 30.0–36.0)
MCV: 99.3 fL (ref 80.0–100.0)
Platelets: 152 10*3/uL (ref 150–400)
RBC: 4.09 MIL/uL (ref 3.87–5.11)
RDW: 13.2 % (ref 11.5–15.5)
WBC: 10 10*3/uL (ref 4.0–10.5)
nRBC: 0 % (ref 0.0–0.2)

## 2019-10-19 LAB — HEMOGLOBIN A1C
Hgb A1c MFr Bld: 6.3 % — ABNORMAL HIGH (ref 4.8–5.6)
Mean Plasma Glucose: 134.11 mg/dL

## 2019-10-19 LAB — TROPONIN I (HIGH SENSITIVITY)
Troponin I (High Sensitivity): 272 ng/L (ref <18)
Troponin I (High Sensitivity): 206 ng/L (ref <18)

## 2019-10-19 LAB — PROCALCITONIN: Procalcitonin: 0.37 ng/mL

## 2019-10-19 MED ORDER — PREDNISONE 20 MG PO TABS
40.0000 mg | ORAL_TABLET | Freq: Every day | ORAL | Status: DC
  Administered 2019-10-19 – 2019-10-24 (×6): 40 mg via ORAL
  Filled 2019-10-19 (×8): qty 2

## 2019-10-19 MED ORDER — FUROSEMIDE 10 MG/ML IJ SOLN
20.0000 mg | Freq: Once | INTRAMUSCULAR | Status: AC
  Administered 2019-10-19: 20 mg via INTRAVENOUS
  Filled 2019-10-19: qty 2

## 2019-10-19 MED ORDER — DOXYCYCLINE HYCLATE 100 MG PO TABS
100.0000 mg | ORAL_TABLET | Freq: Two times a day (BID) | ORAL | Status: DC
  Administered 2019-10-19 – 2019-10-25 (×13): 100 mg via ORAL
  Filled 2019-10-19 (×13): qty 1

## 2019-10-19 MED ORDER — IPRATROPIUM-ALBUTEROL 0.5-2.5 (3) MG/3ML IN SOLN: 3.0000 mL | Freq: Four times a day (QID) | RESPIRATORY_TRACT | Status: DC | PRN

## 2019-10-19 MED ORDER — ATORVASTATIN CALCIUM 40 MG PO TABS
40.0000 mg | ORAL_TABLET | Freq: Every day | ORAL | Status: DC
  Administered 2019-10-19 – 2019-10-24 (×6): 40 mg via ORAL
  Filled 2019-10-19 (×6): qty 1

## 2019-10-19 MED ORDER — SODIUM CHLORIDE 0.9 % IV SOLN
INTRAVENOUS | Status: DC | PRN
  Administered 2019-10-19 – 2019-10-24 (×2): 250 mL via INTRAVENOUS

## 2019-10-19 MED ORDER — IPRATROPIUM-ALBUTEROL 0.5-2.5 (3) MG/3ML IN SOLN
3.0000 mL | Freq: Three times a day (TID) | RESPIRATORY_TRACT | Status: DC
  Filled 2019-10-19: qty 3

## 2019-10-19 NOTE — Progress Notes (Signed)
  Echocardiogram 2D Echocardiogram has been performed.  Gerda Diss 10/19/2019, 2:34 PM

## 2019-10-19 NOTE — Progress Notes (Addendum)
PROGRESS NOTE    Chelsea Goodman  IHK:[id number] DOB: March 07, 1934 DOA: 10/18/2019 PCP: Patient, No Pcp Per   Brief Narrative:  Per HPI: Chelsea Goodman is a 86 y.o. female with medical history significant for CABG, hypertension.  Brought to the ED via EMS for reports of difficulty breathing, on EMS arrival patient was walking around the house confused, O2 sats 84% on room air.  Was placed on nasal cannula and O2 sats improved to 100%. At the time of my evaluation patient is awake alert and oriented.  She does not remember the episode of confusion. She has been having difficulty breathing ongoing over the past week. Patient has a remote history of smoking, over 40 years ago.  But she over the past 20 years she has been using a wood stove at home.  Over the past month the [employer] has being malfunctioning, patient's family has noticed the house smelling more of smoke, but this significantly worsened over the past 2 weeks, with smoke lingering in the air.  Patient's daughter had rattling in patient's chest over the past 2 weeks.  Patient is unaware of rattling or wheezing.  She has a chronic unchanged cough. No chest pain, no leg swelling, no weight gain, no bloating. Patient reports recently she has been having some difficulty swallowing especially her pills.  5/2: Patient was admitted with acute hypoxemic respiratory failure as well as some confusion in the setting of acute CHF exacerbation along with some COPD exacerbation and elevated troponin levels.  Cardiology has evaluated patient with plans for continuation of diuresis with low-dose Lasix today and 2D echocardiogram pending.  She has been started on some IV Unasyn due to concern for some aspiration.  Plan will be to transition to oral doxycycline for COPD coverage along with breathing treatments and oral prednisone.   Assessment & Plan:   Active Problems:   Acute respiratory failure (HCC)   Acute hypoxemic respiratory  failure-multifactorial -Appears to be in the setting of acute diastolic congestive heart failure along with COPD -Continue diuresis per cardiology -Start breathing treatments as well as oral prednisone -IV Unasyn to oral doxycycline today -Incentive spirometry and wean oxygen as tolerated, but may require home oxygen on discharge  Acute diastolic congestive heart failure -Patient without significant volume overload -Await 2D echocardiogram for LV function evaluation and continue diuresis per cardiology  Mild COPD exacerbation -Likely triggered by wood-burning stove at home which will need to be avoided -Start steroids as well as breathing treatments -Wean oxygen as tolerated  Troponin elevation in the setting of CAD with prior CABG -Likely due to demand ischemia in the setting of hypoxemia -No chest pain noted -2D echocardiogram pending and further cardiology recommendations appreciated -Likely need to arrange nuclear stress study on day of discharge -Continue aspirin and statin  Hypertension -Continue current blood pressure medications   DVT prophylaxis: Lovenox Code Status: DNI Family Communication: Discussed with daughter at bedside. Disposition Plan: Continue treatment of COPD as well as CHF.  2D echocardiogram pending.  She still has ongoing dyspnea for which IV Lasix has been ordered.  Anticipate discharge in 24-48 hours once stabilized.   Consultants:   Cardiology  Procedures:   2D echocardiogram pending  Antimicrobials:  Anti-infectives (From admission, onward)   Start     Dose/Rate Route Frequency Ordered Stop   10/18/19 1730  Ampicillin-Sulbactam (UNASYN) 3 g in sodium chloride 0.9 % 100 mL IVPB     3 g 200 mL/hr over 30 Minutes Intravenous Every 8  hours 10/18/19 1713         Subjective: Patient seen and evaluated today with no new acute complaints or concerns. No acute concerns or events noted overnight.  She denies any chest pain or significant  shortness of breath.  Objective: Vitals:   10/19/19 0601 10/19/19 0603 10/19/19 0728 10/19/19 1039  BP: 138/73  136/67 (!) 151/69  Pulse: 72  72 79  Resp: 20  17   Temp: 99 F (37.2 C)  97.7 F (36.5 C)   TempSrc: Oral  Oral   SpO2:   96%   Weight:  36.5 kg    Height:        Intake/Output Summary (Last 24 hours) at 10/19/2019 1056 Last data filed at 10/19/2019 0834 Gross per 24 hour  Intake 317.71 ml  Output 100 ml  Net 217.71 ml   Filed Weights   10/18/19 1015 10/18/19 2300 10/19/19 0603  Weight: 52.2 kg 36.1 kg 36.5 kg    Examination:  General exam: Appears calm and comfortable  Respiratory system: Minimal wheezing on exam noted. Respiratory effort normal.  Currently on 2 L nasal cannula oxygen. Cardiovascular system: S1 & S2 heard, RRR. No JVD, murmurs, rubs, gallops or clicks. No pedal edema. Gastrointestinal system: Abdomen is nondistended, soft and nontender. No organomegaly or masses felt. Normal bowel sounds heard. Central nervous system: Alert and oriented. No focal neurological deficits. Extremities: Symmetric 5 x 5 power. Skin: No rashes, lesions or ulcers Psychiatry: Judgement and insight appear normal. Mood & affect appropriate.     Data Reviewed: I have personally reviewed following labs and imaging studies  CBC: Recent Labs  Lab 10/18/19 1112 10/19/19 0612  WBC 14.9* 10.0  NEUTROABS 12.9*  --   HGB 14.0 13.5  HCT 44.5 40.6  MCV 107.0* 99.3  PLT 165 152   Basic Metabolic Panel: Recent Labs  Lab 10/18/19 1112 10/19/19 0612  NA 136 138  K 4.1 3.8  CL 99 98  CO2 26 28  GLUCOSE 213* 91  BUN 14 14  CREATININE 0.86 1.10*  CALCIUM 8.3* 8.9   GFR: Estimated Creatinine Clearance: 21.9 mL/min (A) (by C-G formula based on SCr of 1.1 mg/dL (H)). Liver Function Tests: Recent Labs  Lab 10/18/19 1112  AST 36  ALT 28  ALKPHOS 85  BILITOT 0.8  PROT 6.8  ALBUMIN 4.2   No results for input(s): LIPASE, AMYLASE in the last 168 hours. No results  for input(s): AMMONIA in the last 168 hours. Coagulation Profile: No results for input(s): INR, PROTIME in the last 168 hours. Cardiac Enzymes: No results for input(s): CKTOTAL, CKMB, CKMBINDEX, TROPONINI in the last 168 hours. BNP (last 3 results) No results for input(s): PROBNP in the last 8760 hours. HbA1C: Recent Labs    10/19/19 0612  HGBA1C 6.3*   CBG: No results for input(s): GLUCAP in the last 168 hours. Lipid Profile: No results for input(s): CHOL, HDL, LDLCALC, TRIG, CHOLHDL, LDLDIRECT in the last 72 hours. Thyroid Function Tests: Recent Labs    10/18/19 1312  TSH 2.079   Anemia Panel: No results for input(s): VITAMINB12, FOLATE, FERRITIN, TIBC, IRON, RETICCTPCT in the last 72 hours. Sepsis Labs: Recent Labs  Lab 10/19/19 0640  PROCALCITON 0.37    Recent Results (from the past 240 hour(s))  Respiratory Panel by RT PCR (Flu A&B, Covid) - Nasopharyngeal Swab     Status: None   Collection Time: 10/18/19 12:04 PM   Specimen: Nasopharyngeal Swab  Result Value Ref Range Status  SARS Coronavirus 2 by RT PCR NEGATIVE NEGATIVE Final    Comment: (NOTE) SARS-CoV-2 target nucleic acids are NOT DETECTED. The SARS-CoV-2 RNA is generally detectable in upper respiratoy specimens during the acute phase of infection. The lowest concentration of SARS-CoV-2 viral copies this assay can detect is 131 copies/mL. A negative result does not preclude SARS-Cov-2 infection and should not be used as the sole basis for treatment or other patient management decisions. A negative result may occur with  improper specimen collection/handling, submission of specimen other than nasopharyngeal swab, presence of viral mutation(s) within the areas targeted by this assay, and inadequate number of viral copies (<131 copies/mL). A negative result must be combined with clinical observations, patient history, and epidemiological information. The expected result is Negative. Fact Sheet for  Patients:  https://www.moore.com/ Fact Sheet for Healthcare Providers:  https://www.young.biz/ This test is not yet ap proved or cleared by the [place] FDA and  has been authorized for detection and/or diagnosis of SARS-CoV-2 by FDA under an Emergency Use Authorization (EUA). This EUA will remain  in effect (meaning this test can be used) for the duration of the COVID-19 declaration under Section 564(b)(1) of the Act, 21 U.S.C. section 360bbb-3(b)(1), unless the authorization is terminated or revoked sooner.    Influenza A by PCR NEGATIVE NEGATIVE Final   Influenza B by PCR NEGATIVE NEGATIVE Final    Comment: (NOTE) The Xpert Xpress SARS-CoV-2/FLU/RSV assay is intended as an aid in  the diagnosis of influenza from Nasopharyngeal swab specimens and  should not be used as a sole basis for treatment. Nasal washings and  aspirates are unacceptable for Xpert Xpress SARS-CoV-2/FLU/RSV  testing. Fact Sheet for Patients: https://www.moore.com/ Fact Sheet for Healthcare Providers: https://www.young.biz/ This test is not yet approved or cleared by the [place] FDA and  has been authorized for detection and/or diagnosis of SARS-CoV-2 by  FDA under an Emergency Use Authorization (EUA). This EUA will remain  in effect (meaning this test can be used) for the duration of the  Covid-19 declaration under Section 564(b)(1) of the Act, 21  U.S.C. section 360bbb-3(b)(1), unless the authorization is  terminated or revoked. Performed at [facility], [address] [place], [place] [address]          Radiology Studies: CT Angio Chest PE W/Cm &/Or Wo Cm  Result Date: 10/18/2019 CLINICAL DATA:  Shortness of breath with hypoxia. Possible pulmonary embolism. EXAM: CT ANGIOGRAPHY CHEST WITH CONTRAST TECHNIQUE: Multidetector CT imaging of the chest was performed using the standard protocol during bolus administration  of intravenous contrast. Multiplanar CT image reconstructions and MIPs were obtained to evaluate the vascular anatomy. CONTRAST:  OMNIPAQUE IOHEXOL 350 MG/ML SOLN COMPARISON:  Chest x-ray today. FINDINGS: Cardiovascular: Mild-to-moderate cardiomegaly. Moderate calcified plaque over the left main and 3 vessel coronary arteries. No evidence of thoracic aortic aneurysm. There is calcified plaque throughout the thoracic aorta. Pulmonary arterial system is well opacified without evidence of emboli. Remaining vascular structures are unremarkable. Mediastinum/Nodes: No mediastinal or hilar adenopathy. Remaining mediastinal structures are unremarkable. Lungs/Pleura: Lungs are adequately inflated with mild to moderate centrilobular emphysematous disease. No focal lobar consolidation. Small bilateral pleural effusions with associated basilar atelectasis. 3 mm sub solid nodule over the posterior left upper lobe. Small amount of aspirate material within the proximal airways bilaterally. Upper Abdomen: Upper pole right renal cyst. Mild prominence of the intrarenal collecting systems bilaterally. Moderate to severe calcified plaque over the abdominal aorta. Musculoskeletal: Minimal degenerative change of the spine. Review of the  MIP images confirms the above findings. IMPRESSION: 1.  No evidence of pulmonary embolism. 2. Small bilateral pleural effusions with associated bibasilar atelectasis. Small amount of aspirate material over the proximal bronchi bilaterally. 3. Aortic Atherosclerosis (ICD10-I70.0) and Emphysema (ICD10-J43.9). Atherosclerotic coronary artery disease. 4.  Cardiomegaly. 5. Right renal cyst. Mild prominence of the intrarenal collecting systems bilaterally. Electronically Signed   By: Elberta Fortis M.D.   On: 10/18/2019 15:20   DG Chest Port 1 View  Result Date: 10/18/2019 CLINICAL DATA:  Shortness of breath EXAM: PORTABLE CHEST 1 VIEW COMPARISON:  None. FINDINGS: Probable background COPD with likely  chronic interstitial prominence. Small left pleural effusion with left basilar atelectasis. Cardiomediastinal contours are within normal limits normal heart size and evidence of CABG. There is calcified plaque along the aortic arch. IMPRESSION: Probable COPD with likely chronic interstitial prominence. Superimposed acute interstitial process is difficult to exclude. Small left pleural effusion with left basilar atelectasis. Electronically Signed   By: Guadlupe Spanish M.D.   On: 10/18/2019 11:47        Scheduled Meds: . aspirin EC  81 mg Oral Daily  . atorvastatin  40 mg Oral q1800  . benazepril  20 mg Oral Daily  . enoxaparin (LOVENOX) injection  30 mg Subcutaneous Q24H  . metoprolol tartrate  25 mg Oral BID   Continuous Infusions: . sodium chloride 250 mL (10/19/19 1013)  . ampicillin-sulbactam (UNASYN) IV 3 g (10/19/19 1015)     LOS: 0 days    Time spent: 35 minutes    Kevonte Vanecek D Sherryll Burger, DO [facility]  If 7PM-7AM, please contact night-coverage [email or web address] 10/19/2019, 10:56 AM

## 2019-10-19 NOTE — Evaluation (Signed)
Clinical/Bedside Swallow Evaluation Patient Details  Name: Chelsea Goodman MRN: [id number] Date of Birth: 02/22/1934  Today's Date: 10/19/2019 Time: SLP Start Time (ACUTE ONLY): 1157 SLP Stop Time (ACUTE ONLY): 1219 SLP Time Calculation (min) (ACUTE ONLY): 22 min  Past Medical History:  Past Medical History:  Diagnosis Date  . CHF (congestive heart failure) (HCC)   . COPD (chronic obstructive pulmonary disease) (HCC)   . Hypertension    Past Surgical History:  Past Surgical History:  Procedure Laterality Date  . CORONARY ARTERY BYPASS GRAFT     HPI:  Chelsea Goodman is a 86 y.o. female with medical history significant for COPD, CABG, hypertension admitted for reports of difficulty breathing. per char patient reports recently she has been having some difficulty swallowing especially her pills. Chest x-ray shows chronic findings suggestive of COPD.  CTA chest shows small pleural effusions, basilar atelectasis. Small amount of aspirate material in bilateral proximal bronchi.   Assessment / Plan / Recommendation Clinical Impression  There were no outward s/s aspiration or dyscoordination of respiration and swallow. She did require brief rest break. No abnormalities controlling, masticating and propelling boluses. Edcuated pt and family on relationship between swallow, respiration and tendancy for decreased sensation if airway is compromised. Reiterated upright position, rest breaks, small volumes. Will attempt to see once more for safety and need for MBS if suspecting silent aspiration.    SLP Visit Diagnosis: Dysphagia, unspecified (R13.10)    Aspiration Risk  Mild aspiration risk    Diet Recommendation Regular;Thin liquid   Liquid Administration via: Cup Medication Administration: Whole meds with liquid(if large whole in puree) Supervision: Patient able to self feed Compensations: Slow rate;Small sips/bites Postural Changes: Seated upright at 90 degrees    Other  Recommendations  Oral Care Recommendations: Oral care BID   Follow up Recommendations None      Frequency and Duration min 1 x/week  1 week       Prognosis Prognosis for Safe Diet Advancement: Good      Swallow Study   General HPI: Chelsea Goodman is a 86 y.o. female with medical history significant for COPD, CABG, hypertension admitted for reports of difficulty breathing. per char patient reports recently she has been having some difficulty swallowing especially her pills. Chest x-ray shows chronic findings suggestive of COPD.  CTA chest shows small pleural effusions, basilar atelectasis. Small amount of aspirate material in bilateral proximal bronchi. Type of Study: Bedside Swallow Evaluation Previous Swallow Assessment: (none) Diet Prior to this Study: Regular;Thin liquids Temperature Spikes Noted: No Respiratory Status: Nasal cannula History of Recent Intubation: No Behavior/Cognition: Alert;Cooperative;Pleasant mood Oral Cavity Assessment: Within Functional Limits Oral Care Completed by SLP: No Oral Cavity - Dentition: Dentures, bottom;Dentures, top Vision: Functional for self-feeding Self-Feeding Abilities: Able to feed self Patient Positioning: Upright in bed Baseline Vocal Quality: Normal Volitional Cough: Weak;Congested Volitional Swallow: Able to elicit    Oral/Motor/Sensory Function Overall Oral Motor/Sensory Function: Within functional limits   Ice Chips Ice chips: Not tested   Thin Liquid Thin Liquid: Within functional limits Presentation: Cup    Nectar Thick Nectar Thick Liquid: Not tested   Honey Thick Honey Thick Liquid: Not tested   Puree Puree: Not tested   Solid     Solid: Within functional limits      Royce Macadamia 10/19/2019,12:30 PM  Breck Coons Lonell Face.Ed Nurse, children's [phone] Office [phone]

## 2019-10-19 NOTE — Progress Notes (Signed)
CRITICAL VALUE ALERT  Critical Value:  Troponin 272  Date & Time Notied:  10/19/2019 0957  Provider Notified: Sherryll Burger MD

## 2019-10-19 NOTE — Progress Notes (Signed)
Discussed patient care with RN.  Patient was resting comfortably and was exhausted.  Decision made to allow patient to sleep unless RN noticed a change in respiratory status.  2 liters of oxygen was in use with 94% saturation.  No respiratory distress noted.

## 2019-10-19 NOTE — Consult Note (Addendum)
Cardiology Consult    Patient ID: Chelsea Goodman; [id number]; [birth year]   Admit date: 10/18/2019 Date of Consult: 10/19/2019  Primary Care Provider: Patient, No Pcp Per Primary Cardiologist: New to [facility] - Will need to establish in [place], [place]  Patient Profile    Chelsea Goodman is a 86 y.o. female with past medical history of CAD (s/p CABG in 2001), HTN, HLD and COPD who is being seen today for the evaluation of CHF at the request of Dr. Mariea Clonts.   History of Present Illness    Chelsea Goodman presented to [facility] ED on 10/18/2019 for evaluation of worsening dyspnea. In talking the patient and her family today, she reports developing acute worsening dyspnea approximately 3 weeks ago and she feels like this might be secondary to her Mare Ferrari as the family had reported worsening smoke throughout the house. She reports having dyspnea at rest and with activity. No recent orthopnea, PND or edema. Weight has been between 80 - 84 lbs on her home scales. She denies any chest pain or palpitations. She does consume a high sodium diet and typically adds salt to most of her meals along with consuming Boj[employer] intermittently. She has not been followed by Cardiology in over 15+ years. Reports her HTN and HLD are followed by her PCP.  Oxygen saturations were initially at 84% on RA upon arrival to the ED and improved to 100% on 2L [place].   Initial labs show WBC 14.9, Hgb 14.0, platelets 165, Na+ 136, K+ 4.1 and creatinine 0.86. BNP 1792. Initial HS Troponin 43 with repeat values of 118, 219 and 331. TSH 2.079. Negative for COVID-19 and Influenza. CXR showed likely COPD and small left pleural effusion with left basilar atelectasis. CTA showed no evidence of a PE but was noted to have small bilateral pleural effusions and bibasilar atelectasis with small amount of aspirate over the proximal bronchi. Report also mentioned aortic atherosclerosis, CAD, cardiomegaly and a right renal cyst. EKG shows NSR, HR 90 with LVH  and TWI along lateral leads which appears most consistent with repol abnormality. No prior tracings available for comparison.   She was started on Unasyn at the time of admission along with receiving IV Lasix 40mg . Was continued on PTA Benazepril 20mg  daily and Lopressor 25mg  BID. Echocardiogram is pending. Repeat labs this AM show creatinine has trended up to 1.10.  Past Medical History:  Diagnosis Date  . CHF (congestive heart failure) (HCC)   . COPD (chronic obstructive pulmonary disease) (HCC)   . Hypertension     Past Surgical History:  Procedure Laterality Date  . CORONARY ARTERY BYPASS GRAFT       Home Medications:  Prior to Admission medications   Medication Sig Start Date End Date Taking? Authorizing Provider  benazepril (LOTENSIN) 20 MG tablet Take 20 mg by mouth daily. 10/13/19   [provider]  levothyroxine (SYNTHROID) 75 MCG tablet Take 75 mcg by mouth daily. 08/23/19   [provider]  metoprolol tartrate (LOPRESSOR) 25 MG tablet Take 25 mg by mouth 2 (two) times daily. 08/09/19   [provider]  simvastatin (ZOCOR) 20 MG tablet Take 20 mg by mouth daily. 10/13/19   [provider]    Inpatient Medications: Scheduled Meds: . aspirin EC  81 mg Oral Daily  . benazepril  20 mg Oral Daily  . enoxaparin (LOVENOX) injection  30 mg Subcutaneous Q24H  . metoprolol tartrate  25 mg Oral BID   Continuous Infusions: . ampicillin-sulbactam (  UNASYN) IV 3 g (10/19/19 0150)   PRN Meds: acetaminophen **OR** acetaminophen, ipratropium-albuterol, labetalol, ondansetron **OR** ondansetron (ZOFRAN) IV, polyethylene glycol  Allergies:   No Known Allergies  Social History:   Social History   Socioeconomic History  . Marital status: Widowed    Spouse name: Not on file  . Number of children: Not on file  . Years of education: Not on file  . Highest education level: Not on file  Occupational History  . Not on file  Tobacco Use  . Smoking  status: Former Games developer  . Smokeless tobacco: Never Used  Substance and Sexual Activity  . Alcohol use: Not Currently  . Drug use: Not Currently  . Sexual activity: Not Currently  Other Topics Concern  . Not on file  Social History Narrative  . Not on file   Social Determinants of Health   Financial Resource Strain:   . Difficulty of Paying Living Expenses:   Food Insecurity:   . Worried About Programme researcher, broadcasting/film/video in the Last Year:   . Barista in the Last Year:   Transportation Needs:   . Freight forwarder (Medical):   Marland Kitchen Lack of Transportation (Non-Medical):   Physical Activity:   . Days of Exercise per Week:   . Minutes of Exercise per Session:   Stress:   . Feeling of Stress :   Social Connections:   . Frequency of Communication with Friends and Family:   . Frequency of Social Gatherings with Friends and Family:   . Attends Religious Services:   . Active Member of Clubs or Organizations:   . Attends Banker Meetings:   Marland Kitchen Marital Status:   Intimate Partner Violence:   . Fear of Current or Ex-Partner:   . Emotionally Abused:   Marland Kitchen Physically Abused:   . Sexually Abused:      Family History:    Family History  Problem Relation Age of Onset  . Hypertension Sister       Review of Systems    General:  No chills, fever, night sweats or weight changes.  Cardiovascular:  No chest pain, edema, orthopnea, palpitations, paroxysmal nocturnal dyspnea. Positive for dyspnea on exertion.  Dermatological: No rash, lesions/masses Respiratory: Positive for cough and dyspnea. Urologic: No hematuria, dysuria Abdominal:   No nausea, vomiting, diarrhea, bright red blood per rectum, melena, or hematemesis Neurologic:  No visual changes, wkns, changes in mental status. All other systems reviewed and are otherwise negative except as noted above.  Physical Exam/Data    Vitals:   10/18/19 2300 10/19/19 0601 10/19/19 0603 10/19/19 0728  BP:  138/73  136/67   Pulse:  72  72  Resp:  20  17  Temp:  99 F (37.2 C)  97.7 F (36.5 C)  TempSrc:  Oral  Oral  SpO2:    96%  Weight: 36.1 kg  36.5 kg   Height:        Intake/Output Summary (Last 24 hours) at 10/19/2019 0901 Last data filed at 10/19/2019 0834 Gross per 24 hour  Intake 317.71 ml  Output 100 ml  Net 217.71 ml   Filed Weights   10/18/19 1015 10/18/19 2300 10/19/19 0603  Weight: 52.2 kg 36.1 kg 36.5 kg   Body mass index is 15.72 kg/m.   General: Pleasant elderly female appearing in NAD Psych: Normal affect. Neuro: Alert and oriented X 3. Moves all extremities spontaneously. HEENT: Normal  Neck: Supple without bruits. JVD at 8cm. Lungs:  Resp regular and unlabored, mild rales along bases. Heart: RRR no s3, s4, 2/6 SEM along RUSB.  Abdomen: Soft, non-tender, non-distended, BS + x 4.  Extremities: No clubbing, cyanosis or lower extremity edema. DP/PT/Radials 2+ and equal bilaterally.   EKG:  The EKG was personally reviewed and demonstrates:  NSR, HR 90 with LVH and TWI along lateral leads which appears most consistent with repol abnormality. No prior tracings available for comparison.   Telemetry:  Telemetry was personally reviewed and demonstrates: NSR, HR in 60's to 80's with occasional PVC's.    Labs/Studies     Relevant CV Studies:  Echocardiogram: Pending  Laboratory Data:  Chemistry Recent Labs  Lab 10/18/19 1112 10/19/19 0612  NA 136 138  K 4.1 3.8  CL 99 98  CO2 26 28  GLUCOSE 213* 91  BUN 14 14  CREATININE 0.86 1.10*  CALCIUM 8.3* 8.9  GFRNONAA >60 46*  GFRAA >60 53*  ANIONGAP 11 12    Recent Labs  Lab 10/18/19 1112  PROT 6.8  ALBUMIN 4.2  AST 36  ALT 28  ALKPHOS 85  BILITOT 0.8   Hematology Recent Labs  Lab 10/18/19 1112 10/19/19 0612  WBC 14.9* 10.0  RBC 4.16 4.09  HGB 14.0 13.5  HCT 44.5 40.6  MCV 107.0* 99.3  MCH 33.7 33.0  MCHC 31.5 33.3  RDW 13.4 13.2  PLT 165 152   Cardiac EnzymesNo results for input(s): TROPONINI in  the last 168 hours. No results for input(s): TROPIPOC in the last 168 hours.  BNP Recent Labs  Lab 10/18/19 1112  BNP 1,792.0*    DDimer No results for input(s): DDIMER in the last 168 hours.  Radiology/Studies:  CT Angio Chest PE W/Cm &/Or Wo Cm  Result Date: 10/18/2019 CLINICAL DATA:  Shortness of breath with hypoxia. Possible pulmonary embolism. EXAM: CT ANGIOGRAPHY CHEST WITH CONTRAST TECHNIQUE: Multidetector CT imaging of the chest was performed using the standard protocol during bolus administration of intravenous contrast. Multiplanar CT image reconstructions and MIPs were obtained to evaluate the vascular anatomy. CONTRAST:  OMNIPAQUE IOHEXOL 350 MG/ML SOLN COMPARISON:  Chest x-ray today. FINDINGS: Cardiovascular: Mild-to-moderate cardiomegaly. Moderate calcified plaque over the left main and 3 vessel coronary arteries. No evidence of thoracic aortic aneurysm. There is calcified plaque throughout the thoracic aorta. Pulmonary arterial system is well opacified without evidence of emboli. Remaining vascular structures are unremarkable. Mediastinum/Nodes: No mediastinal or hilar adenopathy. Remaining mediastinal structures are unremarkable. Lungs/Pleura: Lungs are adequately inflated with mild to moderate centrilobular emphysematous disease. No focal lobar consolidation. Small bilateral pleural effusions with associated basilar atelectasis. 3 mm sub solid nodule over the posterior left upper lobe. Small amount of aspirate material within the proximal airways bilaterally. Upper Abdomen: Upper pole right renal cyst. Mild prominence of the intrarenal collecting systems bilaterally. Moderate to severe calcified plaque over the abdominal aorta. Musculoskeletal: Minimal degenerative change of the spine. Review of the MIP images confirms the above findings. IMPRESSION: 1.  No evidence of pulmonary embolism. 2. Small bilateral pleural effusions with associated bibasilar atelectasis. Small amount of  aspirate material over the proximal bronchi bilaterally. 3. Aortic Atherosclerosis (ICD10-I70.0) and Emphysema (ICD10-J43.9). Atherosclerotic coronary artery disease. 4.  Cardiomegaly. 5. Right renal cyst. Mild prominence of the intrarenal collecting systems bilaterally. Electronically Signed   By: Elberta Fortis M.D.   On: 10/18/2019 15:20   DG Chest Port 1 View  Result Date: 10/18/2019 CLINICAL DATA:  Shortness of breath EXAM: PORTABLE CHEST 1 VIEW COMPARISON:  None. FINDINGS:  Probable background COPD with likely chronic interstitial prominence. Small left pleural effusion with left basilar atelectasis. Cardiomediastinal contours are within normal limits normal heart size and evidence of CABG. There is calcified plaque along the aortic arch. IMPRESSION: Probable COPD with likely chronic interstitial prominence. Superimposed acute interstitial process is difficult to exclude. Small left pleural effusion with left basilar atelectasis. Electronically Signed   By: Macy Mis M.D.   On: 10/18/2019 11:47     Assessment & Plan    1. Acute CHF Exacerbation (Echo pending to determine Systolic vs. Diastolic Dysfunction) - She presented with worsening dyspnea on exertion and at rest for the past 3 weeks and reports symptoms were triggered by smoke in her house secondary to her woodstove. - BNP elevated to 1792 CXR showed likely COPD and small left pleural effusion with left basilar atelectasis. CTA showed no evidence of a PE but was noted to have small bilateral pleural effusions and bibasilar atelectasis with small amount of aspirate over the proximal bronchi. - she received IV Lasix 40mg  on admission and creatinine bumped to 1.10. Will plan to dose 20mg  today and repeat BMET in AM. Continue Lopressor and Benazepril for now. May need to switch Lopressor to Toprol-XL pending reassessment of EF.   2. Elevated Troponin Values/ CAD - she is s/p CABG in 2001 but has only been followed by her PCP for 15+ years.  She denies any recent chest pain. Initial HS Troponin 43 with repeat values of 118, 219 and 331. She is having PVC's on telemetry, therefore will check Mg as well.  - suspect enzyme elevation is secondary to demand ischemia in the setting of COPD and CHF. Echocardiogram pending to assess LV function and wall motion.   2. HTN - BP well-controlled on most recent check, at 136/67. She has been continued on PTA Benazepril 20mg  daily and Lopressor 25mg  BID. May need to adjust her regimen pending echocardiogram results.   3. HLD - recheck FLP as goal LDL is less than 70 with known CAD. She has been continued on PTA Simvastatin 20mg  daily.   4. COPD/Possible Aspiration - CTA showed small amount of aspirate over the proximal bronchi. She has been started on Unasyn. Further management per the admitting team.    For questions or updates, please contact [place] Please consult [email or web address] for contact info under Cardiology/STEMI.  Signed, Erma Heritage, PA-C 10/19/2019, 9:01 AM Pager: [phone] As above, patient seen and examined.  Briefly she is an 86 year old female with past medical history of coronary artery disease status post coronary artery bypass and graft, hypertension, COPD for evaluation of acute diastolic congestive heart failure and elevated troponin.  Patient has a history of coronary artery bypass and graft in 2001.  She has been lost to follow-up and I have no records concerning her LV function.  Patient states that she has had difficulties with her stove at home over the past month.  She burns wood and there has been increasing smoke inside.  Over the past several weeks she has noted increased dyspnea both with exertion and at rest.  Question orthopnea.  She denies pedal edema, chest pain or syncope.  Her symptoms progressively worsened.  Yesterday EMS was called and patient was found confused with oxygen saturations of 84% on room air.  She improved with 2 L and was  transferred to [facility] for [facility] evaluation.  She is symptomatically improved today.  Laboratory showed BNP 1792, troponin 43, 118, 219, 331, 14, 272.  Electrocardiogram shows sinus rhythm, left anterior fascicular block and left ventricular hypertrophy with repolarization abnormality.  MCV 107.  CTA shows no pulmonary embolus.  Small bilateral pleural effusions.  1 acute diastolic congestive heart failure-patient not markedly volume overloaded on exam.  Predominant issue appears to be COPD.  We will give Lasix 20 mg today and reassess tomorrow.  Await echocardiogram for LV function.  Note we do not have records concerning baseline LV status.  2 elevated troponin-likely demand ischemia in the setting of hypoxia.  She has not had chest pain.  Await echocardiogram for LV function.  We will arrange outpatient Lexiscan nuclear study following discharge.  3 COPD-this appears to be the predominant issue.  Pulmonary toilet per primary service.  4 coronary artery disease status post coronary artery bypass graft-continue aspirin.  Resume statin (given coronary artery disease would treat with Lipitor 40 mg daily; check lipids and liver in 12 weeks).  5 hypertension-continue present blood pressure medications.  Olga Millers, MD

## 2019-10-20 DIAGNOSIS — I10 Essential (primary) hypertension: Secondary | ICD-10-CM | POA: Diagnosis not present

## 2019-10-20 DIAGNOSIS — I5041 Acute combined systolic (congestive) and diastolic (congestive) heart failure: Secondary | ICD-10-CM | POA: Diagnosis not present

## 2019-10-20 DIAGNOSIS — R54 Age-related physical debility: Secondary | ICD-10-CM | POA: Diagnosis not present

## 2019-10-20 LAB — LIPID PANEL
Cholesterol: 177 mg/dL (ref 0–200)
HDL: 56 mg/dL (ref >40)
LDL Cholesterol: 104 mg/dL — ABNORMAL HIGH (ref 0–99)
Total CHOL/HDL Ratio: 3.2 RATIO
Triglycerides: 84 mg/dL (ref <150)
VLDL: 17 mg/dL (ref 0–40)

## 2019-10-20 LAB — BASIC METABOLIC PANEL
Anion gap: 11 (ref 5–15)
BUN: 27 mg/dL — ABNORMAL HIGH (ref 8–23)
CO2: 28 mmol/L (ref 22–32)
Calcium: 9 mg/dL (ref 8.9–10.3)
Chloride: 97 mmol/L — ABNORMAL LOW (ref 98–111)
Creatinine, Ser: 1.24 mg/dL — ABNORMAL HIGH (ref 0.44–1.00)
GFR calc Af Amer: 46 mL/min — ABNORMAL LOW (ref >60)
GFR calc non Af Amer: 40 mL/min — ABNORMAL LOW (ref >60)
Glucose, Bld: 110 mg/dL — ABNORMAL HIGH (ref 70–99)
Potassium: 3.6 mmol/L (ref 3.5–5.1)
Sodium: 136 mmol/L (ref 135–145)

## 2019-10-20 LAB — CBC
HCT: 40.5 % (ref 36.0–46.0)
Hemoglobin: 13.4 g/dL (ref 12.0–15.0)
MCH: 33.2 pg (ref 26.0–34.0)
MCHC: 33.1 g/dL (ref 30.0–36.0)
MCV: 100.2 fL — ABNORMAL HIGH (ref 80.0–100.0)
Platelets: 160 10*3/uL (ref 150–400)
RBC: 4.04 MIL/uL (ref 3.87–5.11)
RDW: 13.3 % (ref 11.5–15.5)
WBC: 9.1 10*3/uL (ref 4.0–10.5)
nRBC: 0 % (ref 0.0–0.2)

## 2019-10-20 LAB — MAGNESIUM: Magnesium: 1.9 mg/dL (ref 1.7–2.4)

## 2019-10-20 LAB — BRAIN NATRIURETIC PEPTIDE: B Natriuretic Peptide: 942.1 pg/mL — ABNORMAL HIGH (ref 0.0–100.0)

## 2019-10-20 MED ORDER — CARVEDILOL 6.25 MG PO TABS
6.2500 mg | ORAL_TABLET | Freq: Two times a day (BID) | ORAL | Status: DC
  Administered 2019-10-20 – 2019-10-23 (×6): 6.25 mg via ORAL
  Filled 2019-10-20 (×6): qty 1

## 2019-10-20 NOTE — Plan of Care (Signed)

## 2019-10-20 NOTE — Evaluation (Signed)
Physical Therapy Evaluation Patient Details Name: Chelsea Goodman MRN: [id number] DOB: 12/27/1933 Today's Date: 10/20/2019   History of Present Illness  Chelsea Goodman is a 85 y.o. female with medical history significant for CABG, hypertension.  Brought to the ED via EMS for reports of difficulty breathing, on EMS arrival patient was walking around the house confused, O2 sats 84% on room air.  Was placed on nasal cannula and O2 sats improved to 100%.At the time of my evaluation patient is awake alert and oriented.  She does not remember the episode of confusion. She has been having difficulty breathing ongoing over the past week.  Clinical Impression  Patient received in bed, family present. She agrees to PT session. Reports weakness more than anything due to not being out of bed since she got here. She requires min assist with supine to sit and for sit to stand from bed. She requires B UE assist with ambulation of 20 feet in room. She is unsteady and will benefit from use of ad and continued PT to improve strength and safety with functional mobility to return home.       Follow Up Recommendations Home health PT;Supervision for mobility/OOB- really needs 24 hour assist at this time if she were to go home.     Equipment Recommendations  Rolling walker with 5" wheels    Recommendations for Other Services       Precautions / Restrictions Precautions Precautions: Fall Precaution Comments: mod fall Restrictions Weight Bearing Restrictions: No      Mobility  Bed Mobility Overal bed mobility: Needs Assistance Bed Mobility: Supine to Sit;Sit to Supine     Supine to sit: Min assist Sit to supine: Supervision      Transfers Overall transfer level: Needs assistance Equipment used: 1 person hand held assist Transfers: Sit to/from Stand Sit to Stand: Min assist         General transfer comment: unsteady with initial standing balance  Ambulation/Gait Ambulation/Gait assistance: Min  assist Gait Distance (Feet): 15 Feet Assistive device: 1 person hand held assist Gait Pattern/deviations: Step-through pattern Gait velocity: decr   General Gait Details: unsteady, fatigued with minimal ambulation  Stairs            Wheelchair Mobility    Modified Rankin (Stroke Patients Only)       Balance Overall balance assessment: Needs assistance Sitting-balance support: Feet supported Sitting balance-Leahy Scale: Good     Standing balance support: Single extremity supported;Bilateral upper extremity supported;During functional activity Standing balance-Leahy Scale: Fair Standing balance comment: hand held assist and patient reaching for furniture, door frame etc for stability.                             Pertinent Vitals/Pain Pain Assessment: No/denies pain    Home Living Family/patient expects to be discharged to:: Private residence Living Arrangements: Alone Available Help at Discharge: Family;Available PRN/intermittently Type of Home: House       Home Layout: One level Home Equipment: None      Prior Function Level of Independence: Independent               Hand Dominance        Extremity/Trunk Assessment   Upper Extremity Assessment Upper Extremity Assessment: Generalized weakness    Lower Extremity Assessment Lower Extremity Assessment: Generalized weakness    Cervical / Trunk Assessment Cervical / Trunk Assessment: Kyphotic  Communication   Communication: No difficulties  Cognition Arousal/Alertness: Awake/alert Behavior During Therapy: WFL for tasks assessed/performed Overall Cognitive Status: Within Functional Limits for tasks assessed                                        General Comments      Exercises     Assessment/Plan    PT Assessment Patient needs continued PT services  PT Problem List Decreased strength;Decreased mobility;Decreased activity tolerance;Decreased balance;Decreased  knowledge of use of DME       PT Treatment Interventions DME instruction;Therapeutic activities;Gait training;Therapeutic exercise;Stair training;Balance training;Functional mobility training;Neuromuscular re-education;Patient/family education    PT Goals (Current goals can be found in the Care Plan section)  Acute Rehab PT Goals Patient Stated Goal: to get stronger PT Goal Formulation: With patient Time For Goal Achievement: 11/03/19 Potential to Achieve Goals: Good    Frequency Min 3X/week   Barriers to discharge Decreased caregiver support      Co-evaluation               AM-PAC PT "6 Clicks" Mobility  Outcome Measure Help needed turning from your back to your side while in a flat bed without using bedrails?: A Little Help needed moving from lying on your back to sitting on the side of a flat bed without using bedrails?: A Little Help needed moving to and from a bed to a chair (including a wheelchair)?: A Little Help needed standing up from a chair using your arms (e.g., wheelchair or bedside chair)?: A Little Help needed to walk in hospital room?: A Lot Help needed climbing 3-5 steps with a railing? : A Lot 6 Click Score: 16    End of Session Equipment Utilized During Treatment: Gait belt;Oxygen Activity Tolerance: Patient limited by fatigue Patient left: in bed;with call bell/phone within reach;with family/visitor present Nurse Communication: Mobility status PT Visit Diagnosis: Unsteadiness on feet (R26.81);Other abnormalities of gait and mobility (R26.89);Muscle weakness (generalized) (M62.81);Difficulty in walking, not elsewhere classified (R26.2)    Time: 7371-0626 PT Time Calculation (min) (ACUTE ONLY): 18 min   Charges:   PT Evaluation $PT Eval Moderate Complexity: 1 Mod PT Treatments $Gait Training: 8-22 mins        Ouita Nish, PT, GCS 10/20/19,3:27 PM

## 2019-10-20 NOTE — Progress Notes (Signed)
[name]  EQA:[id number] DOB: 03-Jun-1934 DOA: 10/18/2019 PCP: Patient, No Pcp Per   Brief Narrative:  Per HPI: Chelsea Goodman is a 86 y.o. female with medical history significant for CABG, hypertension.  Brought to the ED via EMS for reports of difficulty breathing, on EMS arrival patient was walking around the house confused, O2 sats 84% on room air.  Was placed on nasal cannula and O2 sats improved to 100%. At the time of my evaluation patient is awake alert and oriented.  She does not remember the episode of confusion. She has been having difficulty breathing ongoing over the past week. Patient has a remote history of smoking, over 40 years ago.  But she over the past 20 years she has been using a wood stove at home.  Over the past month the [employer] has being malfunctioning, patient's family has noticed the house smelling more of smoke, but this significantly worsened over the past 2 weeks, with smoke lingering in the air.  Patient's daughter had rattling in patient's chest over the past 2 weeks.  Patient is unaware of rattling or wheezing.  She has a chronic unchanged cough. No chest pain, no leg swelling, no weight gain, no bloating. Patient reports recently she has been having some difficulty swallowing especially her pills.  5/2: Patient was admitted with acute hypoxemic respiratory failure as well as some confusion in the setting of acute CHF exacerbation along with some COPD exacerbation and elevated troponin levels.  Cardiology has evaluated patient with plans for continuation of diuresis with low-dose Lasix today and 2D echocardiogram pending.  She has been started on some IV Unasyn due to concern for some aspiration.  Plan will be to transition to oral doxycycline for COPD coverage along with breathing treatments and oral prednisone.  5/3 : Patient remains on supplemental oxygen, worsening shortness of breath with exertion, complaining of generalized weaknesses.   Echo has not been finalized, patient remains on IV Lasix per cardiology recommendation.  Elevated creatinine today. Medication remained to be titrated by cardiology Pending final read of 2D echo.  Pending PT evaluation Continuing oral antibiotics of doxycycline, tapered oral prednisone   Subjective:  The patient was seen and examined this morning, stable remain to be on oxygen half liter, satting Denies any chest pain, reported excessive shortness of breath with minimal exertion.    Assessment & Plan:   Active Problems:   Acute respiratory failure (HCC)   Acute hypoxemic respiratory failure-multifactorial -Multifactorial including acute diastolic congestive heart failure exacerbation, COPD -Continue diuresis per cardiology --anticipating tapering down Lasix as creatinine elevated to 1.1 >> to  1.24 -Continue nebs, p.o. prednisone -IV Unasyn DC'd yesterday has been on doxycycline since 5 /2 -Incentive spirometry and wean oxygen as tolerated, but may require home oxygen on discharge  Acute diastolic congestive heart failure -Patient without significant volume overload -Await 2D echocardiogram for LV function evaluation and continue diuresis per cardiology -Appreciate cardiology recommendation and follow-up  Mild COPD exacerbation -Likely triggered by wood-burning stove at home which will need to be avoided -Start steroids as well as breathing treatments --Weaning off O2  Troponin elevation in the setting of CAD with prior CABG -Likely due to ischemic demand, denies any chest pain, No changes in EKG -2D echocardiogram pending, recommendation per cardiology -Likely need to arrange nuclear stress study on day of discharge -Continue aspirin and statin  Hypertension -Continue current blood pressure medications  Mild AKI: Due to aggressive diuresing -Cardiology scaling  down on Lasix.  Prediabetic: CBG runs low fasting lipid panel 91, 110 --on prednisone -A1c 6.4 -At this  point we not recommending any diabetic medications -Recommending diabetic diet  Generalized weaknesses/debility -Patient lives alone at home -Consulted PT for evaluation  DVT prophylaxis: Lovenox Code Status: DNI Family Communication: Discussed with daughter at bedside. Disposition Plan: Continue treatment of COPD as well as CHF.  2D echocardiogram pending.  She still has ongoing dyspnea for which IV Lasix has been ordered.  Anticipate discharge in 24-48 hours once stabilized.   Consultants:   Cardiology  Procedures:   2D echocardiogram pending  Antimicrobials:  Anti-infectives (From admission, onward)   Start     Dose/Rate Route Frequency Ordered Stop   10/19/19 1215  doxycycline (VIBRA-TABS) tablet 100 mg     100 mg Oral Every 12 hours 10/19/19 1117     10/18/19 1730  Ampicillin-Sulbactam (UNASYN) 3 g in sodium chloride 0.9 % 100 mL IVPB  Status:  Discontinued     3 g 200 mL/hr over 30 Minutes Intravenous Every 8 hours 10/18/19 1713 10/19/19 1117      Objective: Vitals:   10/19/19 1039 10/19/19 1255 10/19/19 2107 10/20/19 0524  BP: (!) 151/69 (!) 145/73 124/69 140/69  Pulse: 79 69 62 (!) 58  Resp:  18 18 16   Temp:  97.7 F (36.5 C) 98 F (36.7 C) 97.6 F (36.4 C)  TempSrc:  Oral Oral   SpO2:  92% 92% 96%  Weight:    35.8 kg  Height:        Intake/Output Summary (Last 24 hours) at 10/20/2019 1054 Last data filed at 10/20/2019 0854 Gross per 24 hour  Intake 940 ml  Output 1200 ml  Net -260 ml   Filed Weights   10/18/19 2300 10/19/19 0603 10/20/19 0524  Weight: 36.1 kg 36.5 kg 35.8 kg     Physical Exam  Constitution:  Alert, cooperative, no distress,  Psychiatric: Normal and stable mood and affect, cognition intact,   HEENT: Normocephalic, PERRL, otherwise with in Normal limits  Chest:Chest symmetric Cardio vascular:  S1/S2, RRR, No murmure, No Rubs or Gallops  pulmonary: Clear to auscultation bilaterally, respirations unlabored, negative wheezes /  crackles Abdomen: Soft, non-tender, non-distended, bowel sounds,no masses, no organomegaly Muscular skeletal: Limited exam - in bed, able to move all 4 extremities, Normal strength,  Neuro: CNII-XII intact. , normal motor and sensation, reflexes intact  Extremities: No pitting edema lower extremities, +2 pulses  Skin: Dry, warm to touch, negative for any Rashes, No open wounds Wounds: per nursing documentation       Data Reviewed: I have personally reviewed following labs and imaging studies  CBC: Recent Labs  Lab 10/18/19 1112 10/19/19 0612 10/20/19 0617  WBC 14.9* 10.0 9.1  NEUTROABS 12.9*  --   --   HGB 14.0 13.5 13.4  HCT 44.5 40.6 40.5  MCV 107.0* 99.3 100.2*  PLT 165 152 400   Basic Metabolic Panel: Recent Labs  Lab 10/18/19 1112 10/19/19 0612 10/20/19 0617  NA 136 138 136  K 4.1 3.8 3.6  CL 99 98 97*  CO2 26 28 28   GLUCOSE 213* 91 110*  BUN 14 14 27*  CREATININE 0.86 1.10* 1.24*  CALCIUM 8.3* 8.9 9.0  MG  --   --  1.9   GFR: Estimated Creatinine Clearance: 19.1 mL/min (A) (by C-G formula based on SCr of 1.24 mg/dL (H)). Liver Function Tests: Recent Labs  Lab 10/18/19 1112  AST 36  ALT 28  ALKPHOS 85  BILITOT 0.8  PROT 6.8  ALBUMIN 4.2   HbA1C: Recent Labs    10/19/19 0612  HGBA1C 6.3*   CBG: No results for input(s): GLUCAP in the last 168 hours. Lipid Profile: Recent Labs    10/20/19 0617  CHOL 177  HDL 56  LDLCALC 104*  TRIG 84  CHOLHDL 3.2   Thyroid Function Tests: Recent Labs    10/18/19 1312  TSH 2.079   Anemia Panel: No results for input(s): VITAMINB12, FOLATE, FERRITIN, TIBC, IRON, RETICCTPCT in the last 72 hours. Sepsis Labs: Recent Labs  Lab 10/19/19 0640  PROCALCITON 0.37    Recent Results (from the past 240 hour(s))  Respiratory Panel by RT PCR (Flu A&B, Covid) - Nasopharyngeal Swab     Status: None   Collection Time: 10/18/19 12:04 PM   Specimen: Nasopharyngeal Swab  Result Value Ref Range Status   SARS  Coronavirus 2 by RT PCR NEGATIVE NEGATIVE Final    Comment: (NOTE) SARS-CoV-2 target nucleic acids are NOT DETECTED. The SARS-CoV-2 RNA is generally detectable in upper respiratoy specimens during the acute phase of infection. The lowest concentration of SARS-CoV-2 viral copies this assay can detect is 131 copies/mL. A negative result does not preclude SARS-Cov-2 infection and should not be used as the sole basis for treatment or other patient management decisions. A negative result may occur with  improper specimen collection/handling, submission of specimen other than nasopharyngeal swab, presence of viral mutation(s) within the areas targeted by this assay, and inadequate number of viral copies (<131 copies/mL). A negative result must be combined with clinical observations, patient history, and epidemiological information. The expected result is Negative. Fact Sheet for Patients:  https://www.moore.com/ Fact Sheet for Healthcare Providers:  https://www.young.biz/ This test is not yet ap proved or cleared by the [place] FDA and  has been authorized for detection and/or diagnosis of SARS-CoV-2 by FDA under an Emergency Use Authorization (EUA). This EUA will remain  in effect (meaning this test can be used) for the duration of the COVID-19 declaration under Section 564(b)(1) of the Act, 21 U.S.C. section 360bbb-3(b)(1), unless the authorization is terminated or revoked sooner.    Influenza A by PCR NEGATIVE NEGATIVE Final   Influenza B by PCR NEGATIVE NEGATIVE Final    Comment: (NOTE) The Xpert Xpress SARS-CoV-2/FLU/RSV assay is intended as an aid in  the diagnosis of influenza from Nasopharyngeal swab specimens and  should not be used as a sole basis for treatment. Nasal washings and  aspirates are unacceptable for Xpert Xpress SARS-CoV-2/FLU/RSV  testing. Fact Sheet for Patients: https://www.moore.com/ Fact Sheet  for Healthcare Providers: https://www.young.biz/ This test is not yet approved or cleared by the [place] FDA and  has been authorized for detection and/or diagnosis of SARS-CoV-2 by  FDA under an Emergency Use Authorization (EUA). This EUA will remain  in effect (meaning this test can be used) for the duration of the  Covid-19 declaration under Section 564(b)(1) of the Act, 21  U.S.C. section 360bbb-3(b)(1), unless the authorization is  terminated or revoked. Performed at [facility], [address], [place], [place] [address]          Radiology Studies: CT Angio Chest PE W/Cm &/Or Wo Cm  Result Date: 10/18/2019 CLINICAL DATA:  Shortness of breath with hypoxia. Possible pulmonary embolism. EXAM: CT ANGIOGRAPHY CHEST WITH CONTRAST TECHNIQUE: Multidetector CT imaging of the chest was performed using the standard protocol during bolus administration of intravenous contrast. Multiplanar CT image reconstructions and MIPs were  obtained to evaluate the vascular anatomy. CONTRAST:  OMNIPAQUE IOHEXOL 350 MG/ML SOLN COMPARISON:  Chest x-ray today. FINDINGS: Cardiovascular: Mild-to-moderate cardiomegaly. Moderate calcified plaque over the left main and 3 vessel coronary arteries. No evidence of thoracic aortic aneurysm. There is calcified plaque throughout the thoracic aorta. Pulmonary arterial system is well opacified without evidence of emboli. Remaining vascular structures are unremarkable. Mediastinum/Nodes: No mediastinal or hilar adenopathy. Remaining mediastinal structures are unremarkable. Lungs/Pleura: Lungs are adequately inflated with mild to moderate centrilobular emphysematous disease. No focal lobar consolidation. Small bilateral pleural effusions with associated basilar atelectasis. 3 mm sub solid nodule over the posterior left upper lobe. Small amount of aspirate material within the proximal airways bilaterally. Upper Abdomen: Upper pole right renal cyst. Mild  prominence of the intrarenal collecting systems bilaterally. Moderate to severe calcified plaque over the abdominal aorta. Musculoskeletal: Minimal degenerative change of the spine. Review of the MIP images confirms the above findings. IMPRESSION: 1.  No evidence of pulmonary embolism. 2. Small bilateral pleural effusions with associated bibasilar atelectasis. Small amount of aspirate material over the proximal bronchi bilaterally. 3. Aortic Atherosclerosis (ICD10-I70.0) and Emphysema (ICD10-J43.9). Atherosclerotic coronary artery disease. 4.  Cardiomegaly. 5. Right renal cyst. Mild prominence of the intrarenal collecting systems bilaterally. Electronically Signed   By: Elberta Fortis M.D.   On: 10/18/2019 15:20   DG Chest Port 1 View  Result Date: 10/18/2019 CLINICAL DATA:  Shortness of breath EXAM: PORTABLE CHEST 1 VIEW COMPARISON:  None. FINDINGS: Probable background COPD with likely chronic interstitial prominence. Small left pleural effusion with left basilar atelectasis. Cardiomediastinal contours are within normal limits normal heart size and evidence of CABG. There is calcified plaque along the aortic arch. IMPRESSION: Probable COPD with likely chronic interstitial prominence. Superimposed acute interstitial process is difficult to exclude. Small left pleural effusion with left basilar atelectasis. Electronically Signed   By: Guadlupe Spanish M.D.   On: 10/18/2019 11:47   ECHOCARDIOGRAM COMPLETE  Result Date: 10/19/2019    ECHOCARDIOGRAM REPORT   Patient Name:   Chelsea Goodman Date of Exam: 10/19/2019 Medical Rec #:  [id number]      Height:       60.0 in Accession #:    [id number]     Weight:       80.5 lb Date of Birth:  10/31/1933      BSA:          1.268 m Patient Age:    86 years       BP:           145/73 mmHg Patient Gender: F              HR:           69 bpm. Exam Location:  Inpatient Procedure: 2D Echo, Cardiac Doppler and Color Doppler Indications:    Elevated troponin  History:        Patient has  no prior history of Echocardiogram examinations.                 Prior CABG; Risk Factors:Hypertension.  Sonographer:    Ross Ludwig RDCS (AE) Referring Phys: [id number] [address] [facility] IMPRESSIONS  1. Global hypokinesis with akinesis of the inferolateral wall and apex; overall severely reduced LV systolic function; grade 1 diastolic dysfunction; moderate LVH; trace AI; mild MR; mild LAE.  2. Left ventricular ejection fraction, by estimation, is 25 to 30%. The left ventricle has severely decreased function. The left ventricle demonstrates regional wall motion abnormalities (see  scoring diagram/findings for description). There is moderate left ventricular hypertrophy. Left ventricular diastolic parameters are consistent with Grade I diastolic dysfunction (impaired relaxation).  3. Right ventricular systolic function is normal. The right ventricular size is normal. There is normal pulmonary artery systolic pressure.  4. Left atrial size was mildly dilated.  5. The mitral valve is normal in structure. Mild mitral valve regurgitation. No evidence of mitral stenosis.  6. The aortic valve is tricuspid. Aortic valve regurgitation is trivial. Mild to moderate aortic valve sclerosis/calcification is present, without any evidence of aortic stenosis.  7. The inferior vena cava is normal in size with greater than 50% respiratory variability, suggesting right atrial pressure of 3 mmHg. FINDINGS  Left Ventricle: Left ventricular ejection fraction, by estimation, is 25 to 30%. The left ventricle has severely decreased function. The left ventricle demonstrates regional wall motion abnormalities. The left ventricular internal cavity size was normal  in size. There is moderate left ventricular hypertrophy. Left ventricular diastolic parameters are consistent with Grade I diastolic dysfunction (impaired relaxation). Right Ventricle: The right ventricular size is normal. Right ventricular systolic function is normal. There is normal  pulmonary artery systolic pressure. The tricuspid regurgitant velocity is 2.71 m/s, and with an assumed right atrial pressure of 3 mmHg,  the estimated right ventricular systolic pressure is 32.4 mmHg. Left Atrium: Left atrial size was mildly dilated. Right Atrium: Right atrial size was normal in size. Pericardium: There is no evidence of pericardial effusion. Mitral Valve: The mitral valve is normal in structure. Normal mobility of the mitral valve leaflets. Mild mitral valve regurgitation. No evidence of mitral valve stenosis. MV peak gradient, 2.2 mmHg. The mean mitral valve gradient is 1.0 mmHg. Tricuspid Valve: The tricuspid valve is normal in structure. Tricuspid valve regurgitation is trivial. No evidence of tricuspid stenosis. Aortic Valve: The aortic valve is tricuspid. Aortic valve regurgitation is trivial. Aortic regurgitation PHT measures 452 msec. Mild to moderate aortic valve sclerosis/calcification is present, without any evidence of aortic stenosis. Aortic valve mean gradient measures 2.0 mmHg. Aortic valve peak gradient measures 3.8 mmHg. Aortic valve area, by VTI measures 1.68 cm. Pulmonic Valve: The pulmonic valve was not well visualized. Pulmonic valve regurgitation is not visualized. No evidence of pulmonic stenosis. Aorta: The aortic root is normal in size and structure. Venous: The inferior vena cava is normal in size with greater than 50% respiratory variability, suggesting right atrial pressure of 3 mmHg. IAS/Shunts: No atrial level shunt detected by color flow Doppler. Additional Comments: Global hypokinesis with akinesis of the inferolateral wall and apex; overall severely reduced LV systolic function; grade 1 diastolic dysfunction; moderate LVH; trace AI; mild MR; mild LAE.  LEFT VENTRICLE PLAX 2D LVIDd:         4.70 cm  Diastology LVIDs:         4.50 cm  LV e' lateral:   5.31 cm/s LV PW:         1.50 cm  LV E/e' lateral: 10.8 LV IVS:        1.90 cm  LV e' medial:    3.75 cm/s LVOT diam:      1.80 cm  LV E/e' medial:  15.3 LV SV:         35 LV SV Index:   27 LVOT Area:     2.54 cm  RIGHT VENTRICLE            IVC RV Basal diam:  3.30 cm    IVC diam: 1.40 cm RV S  prime:     9.87 cm/s TAPSE (M-mode): 1.8 cm LEFT ATRIUM             Index       RIGHT ATRIUM           Index LA diam:        3.90 cm 3.08 cm/m  RA Area:     13.30 cm LA Vol (A2C):   45.9 ml 36.21 ml/m RA Volume:   30.90 ml  24.38 ml/m LA Vol (A4C):   44.8 ml 35.34 ml/m LA Biplane Vol: 45.8 ml 36.13 ml/m  AORTIC VALVE AV Area (Vmax):    1.72 cm AV Area (Vmean):   1.70 cm AV Area (VTI):     1.68 cm AV Vmax:           97.00 cm/s AV Vmean:          71.700 cm/s AV VTI:            0.206 m AV Peak Grad:      3.8 mmHg AV Mean Grad:      2.0 mmHg LVOT Vmax:         65.40 cm/s LVOT Vmean:        47.900 cm/s LVOT VTI:          0.136 m LVOT/AV VTI ratio: 0.66 AI PHT:            452 msec  AORTA Ao Root diam: 3.10 cm Ao Asc diam:  3.20 cm MITRAL VALVE               TRICUSPID VALVE MV Area (PHT): 2.80 cm    TR Peak grad:   29.4 mmHg MV Peak grad:  2.2 mmHg    TR Vmax:        271.00 cm/s MV Mean grad:  1.0 mmHg MV Vmax:       0.75 m/s    SHUNTS MV Vmean:      51.1 cm/s   Systemic VTI:  0.14 m MV Decel Time: 271 msec    Systemic Diam: 1.80 cm MR Peak grad: 100.0 mmHg MR Mean grad: 63.0 mmHg MR Vmax:      500.00 cm/s MR Vmean:     370.0 cm/s MV E velocity: 57.40 cm/s MV A velocity: 71.10 cm/s MV E/A ratio:  0.81 Olga Millers MD Electronically signed by Olga Millers MD Signature Date/Time: 10/19/2019/2:51:32 PM    Final         Scheduled Meds: . aspirin EC  81 mg Oral Daily  . atorvastatin  40 mg Oral q1800  . benazepril  20 mg Oral Daily  . doxycycline  100 mg Oral Q12H  . enoxaparin (LOVENOX) injection  30 mg Subcutaneous Q24H  . metoprolol tartrate  25 mg Oral BID  . predniSONE  40 mg Oral Q breakfast   Continuous Infusions: . sodium chloride 250 mL (10/19/19 1013)     LOS: 1 day    Time spent: 35 minutes    Kendell Bane, MD [facility]  If 7PM-7AM, please contact night-coverage [email or web address] 10/20/2019, 10:54 AM
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
## 2019-10-21 ENCOUNTER — Inpatient Hospital Stay (HOSPITAL_COMMUNITY): Payer: Medicare Other

## 2019-10-21 DIAGNOSIS — I248 Other forms of acute ischemic heart disease: Secondary | ICD-10-CM | POA: Diagnosis not present

## 2019-10-21 DIAGNOSIS — R079 Chest pain, unspecified: Secondary | ICD-10-CM

## 2019-10-21 DIAGNOSIS — E7800 Pure hypercholesterolemia, unspecified: Secondary | ICD-10-CM | POA: Diagnosis not present

## 2019-10-21 DIAGNOSIS — J441 Chronic obstructive pulmonary disease with (acute) exacerbation: Secondary | ICD-10-CM | POA: Diagnosis present

## 2019-10-21 DIAGNOSIS — I2510 Atherosclerotic heart disease of native coronary artery without angina pectoris: Secondary | ICD-10-CM | POA: Diagnosis not present

## 2019-10-21 DIAGNOSIS — I5043 Acute on chronic combined systolic (congestive) and diastolic (congestive) heart failure: Secondary | ICD-10-CM | POA: Diagnosis present

## 2019-10-21 DIAGNOSIS — R5381 Other malaise: Secondary | ICD-10-CM | POA: Diagnosis present

## 2019-10-21 DIAGNOSIS — I5041 Acute combined systolic (congestive) and diastolic (congestive) heart failure: Secondary | ICD-10-CM | POA: Diagnosis not present

## 2019-10-21 LAB — BASIC METABOLIC PANEL
Anion gap: 10 (ref 5–15)
Anion gap: 8 (ref 5–15)
BUN: 33 mg/dL — ABNORMAL HIGH (ref 8–23)
BUN: 34 mg/dL — ABNORMAL HIGH (ref 8–23)
CO2: 29 mmol/L (ref 22–32)
CO2: 28 mmol/L (ref 22–32)
Calcium: 9 mg/dL (ref 8.9–10.3)
Calcium: 8.9 mg/dL (ref 8.9–10.3)
Chloride: 98 mmol/L (ref 98–111)
Chloride: 99 mmol/L (ref 98–111)
Creatinine, Ser: 1.01 mg/dL — ABNORMAL HIGH (ref 0.44–1.00)
Creatinine, Ser: 0.92 mg/dL (ref 0.44–1.00)
GFR calc Af Amer: 59 mL/min — ABNORMAL LOW (ref >60)
GFR calc Af Amer: >60 mL/min (ref >60)
GFR calc non Af Amer: 51 mL/min — ABNORMAL LOW (ref >60)
GFR calc non Af Amer: 57 mL/min — ABNORMAL LOW (ref >60)
Glucose, Bld: 104 mg/dL — ABNORMAL HIGH (ref 70–99)
Glucose, Bld: 107 mg/dL — ABNORMAL HIGH (ref 70–99)
Potassium: 3.5 mmol/L (ref 3.5–5.1)
Potassium: 3.7 mmol/L (ref 3.5–5.1)
Sodium: 137 mmol/L (ref 135–145)
Sodium: 135 mmol/L (ref 135–145)

## 2019-10-21 LAB — NM MYOCAR MULTI W/SPECT W/WALL MOTION / EF
Estimated workload: 1 METS
Exercise duration (min): 5 min
Exercise duration (sec): 46 s
Peak HR: 81 {beats}/min
Rest HR: 57 {beats}/min

## 2019-10-21 LAB — BRAIN NATRIURETIC PEPTIDE
B Natriuretic Peptide: 851.4 pg/mL — ABNORMAL HIGH (ref 0.0–100.0)
B Natriuretic Peptide: 988.1 pg/mL — ABNORMAL HIGH (ref 0.0–100.0)

## 2019-10-21 MED ORDER — REGADENOSON 0.4 MG/5ML IV SOLN: 0.4000 mg | Freq: Once | INTRAVENOUS | Status: AC

## 2019-10-21 MED ORDER — LOSARTAN POTASSIUM 25 MG PO TABS
25.0000 mg | ORAL_TABLET | Freq: Every day | ORAL | Status: DC
  Administered 2019-10-21: 25 mg via ORAL
  Filled 2019-10-21: qty 1

## 2019-10-21 MED ORDER — TECHNETIUM TC 99M TETROFOSMIN IV KIT
10.4000 | PACK | Freq: Once | INTRAVENOUS | Status: AC | PRN
  Administered 2019-10-21: 10.4 via INTRAVENOUS

## 2019-10-21 MED ORDER — FUROSEMIDE 40 MG PO TABS
40.0000 mg | ORAL_TABLET | Freq: Once | ORAL | Status: AC
  Administered 2019-10-21: 40 mg via ORAL
  Filled 2019-10-21: qty 1

## 2019-10-21 MED ORDER — REGADENOSON 0.4 MG/5ML IV SOLN
INTRAVENOUS | Status: AC
  Administered 2019-10-21: 0.4 mg via INTRAVENOUS
  Filled 2019-10-21: qty 5

## 2019-10-21 MED ORDER — TECHNETIUM TC 99M TETROFOSMIN IV KIT
31.1000 | PACK | Freq: Once | INTRAVENOUS | Status: AC | PRN
  Administered 2019-10-21: 31.1 via INTRAVENOUS

## 2019-10-21 NOTE — Progress Notes (Signed)
Patient has had to wear 0.5-1L of O2 via nasal cannula since yesterday- desats to 80s without.

## 2019-10-21 NOTE — Progress Notes (Signed)
SLP Cancellation Note  Patient Details Name: Chelsea Goodman MRN: [id number] DOB: 07-Sep-1933   Cancelled treatment:       Reason Eval/Treat Not Completed: Other (comment). Unable to assess diet tolerance at this time, as pt is currently NPO for possible cardiac procedure. Will continue efforts.  Andres Vest B. Murvin Natal, [facility], CCC-SLP Speech Language Pathologist Office: [phone] Pager: [phone]  Leigh Aurora 10/21/2019, 12:19 PM

## 2019-10-21 NOTE — Plan of Care (Signed)
  Problem: Clinical Measurements: Goal: Will remain free from infection Outcome: Progressing   Problem: Activity: Goal: Risk for activity intolerance will decrease Outcome: Progressing   Problem: Safety: Goal: Ability to remain free from injury will improve Outcome: Progressing   

## 2019-10-21 NOTE — Progress Notes (Signed)
Patient is wondering about taking her daily levothyroxine.

## 2019-10-21 NOTE — Progress Notes (Signed)
Patient has arrived back on to the unit from having the stress test. Patient is alert and oriented x4. Patient has no complaints of chest pain. MD paged and notified about existing NPO ordered. Verbal order to d/c NPO order and resume heart healthy diet order. Lunch and dinner ordered.CCMD has been notified about the patient's return and tele monitor has been reapplied.

## 2019-10-21 NOTE — Progress Notes (Signed)
CCMD called to clarify active tele order. Shahmehdi, MD paged and notified. MD verbalizes to not renew tele order. CCMD notifed.
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
## 2019-10-21 NOTE — Progress Notes (Signed)
Chelsea Goodman presented for a nuclear stress test today.  No immediate complications.  Stress imaging is pending at this time.   Preliminary EKG findings may be listed in the chart, but the stress test result will not be finalized until perfusion imaging is complete.   [place], [place]  10/21/2019 10:39 AM

## 2019-10-21 NOTE — Progress Notes (Signed)
PROGRESS NOTE    Chelsea Goodman  OVZ:[id number] DOB: 01-12-1934 DOA: 10/18/2019 PCP: Patient, No Pcp Per   Subjective:  The patient was seen and examined this morning, stable no acute distress denies any chest pain, reporting improved shortness of breath but worsening with exertion. Denies of having any lower extremity edema. N.p.o. overnight in anticipation of cardiac stress test today.  Notify the patient that I have spoken to her daughter extensively she is planning to take her to [facility] after discharge.  ----------------------------------------------------------------------------------------------------------------------- Brief Narrative:  Per HPI: Chelsea Goodman is a 87 y.o. female with medical history significant for CABG, hypertension.  Brought to the ED via EMS for reports of difficulty breathing, on EMS arrival patient was walking around the house confused, O2 sats 84% on room air.  Was placed on nasal cannula and O2 sats improved to 100%. At the time of my evaluation patient is awake alert and oriented.  She does not remember the episode of confusion. She has been having difficulty breathing ongoing over the past week. Patient has a remote history of smoking, over 40 years ago.  But she over the past 20 years she has been using a wood stove at home.  Over the past month the [employer] has being malfunctioning, patient's family has noticed the house smelling more of smoke, but this significantly worsened over the past 2 weeks, with smoke lingering in the air.  Patient's daughter had rattling in patient's chest over the past 2 weeks.  Patient is unaware of rattling or wheezing.  She has a chronic unchanged cough. No chest pain, no leg swelling, no weight gain, no bloating. Patient reports recently she has been having some difficulty swallowing especially her pills.  5/2: Patient was admitted with acute hypoxemic respiratory failure as well as some confusion in the setting of acute  CHF exacerbation along with some COPD exacerbation and elevated troponin levels.  Cardiology has evaluated patient with plans for continuation of diuresis with low-dose Lasix today and 2D echocardiogram pending.  She has been started on some IV Unasyn due to concern for some aspiration.  Plan will be to transition to oral doxycycline for COPD coverage along with breathing treatments and oral prednisone.  10/20/19 : Patient remains on supplemental oxygen, worsening shortness of breath with exertion, complaining of generalized weaknesses.  Echo has not been finalized, patient remains on IV Lasix per cardiology recommendation.  Elevated creatinine today. Medication remained to be titrated by cardiology Pending final read of 2D echo.  Pending PT evaluation Continuing oral antibiotics of doxycycline, tapered oral prednisone  5/4/202: Invasive reviewed echocardiogram with severely reduced cardiac function, ejection fraction 25-30%, global hypokinesis -cardiology recommended NM myocardial perfusion Lexiscan stress test --may need left heart catheterization but limited due to elevated creatinine    -------------------------------------------------------------------------------------------------------------------------   Assessment & Plan:   Active Problems:   Acute respiratory failure (HCC)   Acute hypoxemic respiratory failure-multifactorial -Multifactorial including acute diastolic congestive heart failure exacerbation, COPD  -Per cardiology on diuretics Lasix was held yesterday due to elevated creatinine 1.1 >> to  1.24 >>> 0.92 -Cardiology started p.o. Lasix, -Continue nebs, p.o. prednisone -Empiric antibiotics: doxycycline since 5 /2 -Incentive spirometry and wean oxygen as tolerated, but may require home oxygen on discharge  Acute diastolic congestive heart failure/ h/o CAD w CABG 2001 -Mildly elevated troponin likely ischemic demand, exacerbated by CHF/  COPD -Denies any chest  pain  -2D echocardiogram:  reviewed, severe reduced cardiac function noted ejection fraction 25-30%, severe global hypokinesis -  proBNP on admission was elevated/1792, -Cardiology planning for further evaluation by an MRI cardiopulmonary stress test today 10/21/2019  -Cardiology optimizing current medication, started on losartan, plan to switch to [facility] after her ACE inhibitor is held for 48 hours, -Continue carvedilol, aspirin, statins -As needed nitroglycerin morphine  -Appreciate cardiology recommendation and follow-up  Mild COPD exacerbation -Likely triggered by wood-burning stove at home which will need to be avoided -Start steroids as well as breathing treatments --Weaning off O2   Hypertension -Continue current blood pressure medications As above  Mild AKI: Due to aggressive diuresing -Creatinine 1.1 >> to  1.24 >>> 0.92 -Try to avoid nephrotoxins, IV Lasix was switched to p.o. today  Prediabetic: CBG runs low fasting lipid panel 91, 110 --on prednisone -A1c 6.4 -At this point we not recommending any diabetic medications -Recommending diabetic diet  Generalized weaknesses/debility -Patient lives alone at home -Consulted PT for evaluation  DVT prophylaxis: Lovenox Code Status: DNI Family Communication: Discussed with daughter at bedside. Daughter very concerned about the patient planning to take the patient to [place] post discharge.  Disposition Plan: Inpatient, continue treatment of COPD as well as CHF. Needing subspecialty cardiology input management titration medication, planning for evaluation of acute heart failure, with a stress test, possible cardiac catheterization  Anticipate discharge in 48 hours once stabilized.     Consultants:   Cardiology  Procedures:   2D echocardiogram:  10/19/19: IMPRESSIONS   1. Global hypokinesis with akinesis of the inferolateral wall and apex;  overall severely reduced LV systolic function; grade 1 diastolic   dysfunction; moderate LVH; trace AI; mild MR; mild LAE.  2. Left ventricular ejection fraction, by estimation, is 25 to 30%. The  left ventricle has severely decreased function. The left ventricle  demonstrates regional wall motion abnormalities (see scoring  diagram/findings for description). There is moderate  left ventricular hypertrophy. Left ventricular diastolic parameters are  consistent with Grade I diastolic dysfunction (impaired relaxation).  3. Right ventricular systolic function is normal. The right ventricular  size is normal. There is normal pulmonary artery systolic pressure.  4. Left atrial size was mildly dilated.  5. The mitral valve is normal in structure. Mild mitral valve  regurgitation. No evidence of mitral stenosis.  6. The aortic valve is tricuspid. Aortic valve regurgitation is trivial.  Mild to moderate aortic valve sclerosis/calcification is present, without  any evidence of aortic stenosis.  7. The inferior vena cava is normal in size with greater than 50%  respiratory variability, suggesting right atrial pressure of 3 mmHg.    Antimicrobials:  Anti-infectives (From admission, onward)   Start     Dose/Rate Route Frequency Ordered Stop   10/19/19 1215  doxycycline (VIBRA-TABS) tablet 100 mg     100 mg Oral Every 12 hours 10/19/19 1117     10/18/19 1730  Ampicillin-Sulbactam (UNASYN) 3 g in sodium chloride 0.9 % 100 mL IVPB  Status:  Discontinued     3 g 200 mL/hr over 30 Minutes Intravenous Every 8 hours 10/18/19 1713 10/19/19 1117      Objective: Vitals:   10/20/19 1700 10/20/19 2033 10/21/19 0346 10/21/19 0604  BP: 125/70 118/68 (!) 150/84 137/75  Pulse:  (!) 58 69 61  Resp:  18 16   Temp: 98 F (36.7 C) 97.7 F (36.5 C) 97.6 F (36.4 C)   TempSrc: Oral Oral Oral   SpO2: 93% 91% 92%   Weight:   35.9 kg   Height:        Intake/Output Summary (  Last 24 hours) at 10/21/2019 0939 Last data filed at 10/21/2019 0857 Gross per 24 hour  Intake  724.08 ml  Output 650 ml  Net 74.08 ml   Filed Weights   10/19/19 0603 10/20/19 0524 10/21/19 0346  Weight: 36.5 kg 35.8 kg 35.9 kg      Physical Exam  Constitution:  Alert, cooperative, no distress,  Psychiatric: Normal and stable mood and affect, cognition intact,   HEENT: Normocephalic, PERRL, otherwise with in Normal limits  Chest:Chest symmetric Cardio vascular:  S1/S2, RRR, No murmure, No Rubs or Gallops  pulmonary: Clear to auscultation bilaterally, respirations unlabored, negative wheezes / crackles Abdomen: Soft, non-tender, non-distended, bowel sounds,no masses, no organomegaly Muscular skeletal:  Generalized weakness is noted, Limited exam - in bed, able to move all 4 extremities, Normal strength,  Neuro: CNII-XII intact. , normal motor and sensation, reflexes intact  Extremities: No pitting edema lower extremities, +2 pulses  Skin: Dry, warm to touch, negative for any Rashes, No open wounds Wounds: per nursing documentation      Data Reviewed: I have personally reviewed following labs and imaging studies  CBC: Recent Labs  Lab 10/18/19 1112 10/19/19 0612 10/20/19 0617  WBC 14.9* 10.0 9.1  NEUTROABS 12.9*  --   --   HGB 14.0 13.5 13.4  HCT 44.5 40.6 40.5  MCV 107.0* 99.3 100.2*  PLT 165 152 160   Basic Metabolic Panel: Recent Labs  Lab 10/18/19 1112 10/19/19 0612 10/20/19 0617 10/21/19 0711  NA 136 138 136 135  K 4.1 3.8 3.6 3.7  CL 99 98 97* 99  CO2 GLUCOSE 213* 91 110* 107*  BUN 14 14 27* 34*  CREATININE 0.86 1.10* 1.24* 0.92  CALCIUM 8.3* 8.9 9.0 8.9  MG  --   --  1.9  --    GFR: Estimated Creatinine Clearance: 25.8 mL/min (by C-G formula based on SCr of 0.92 mg/dL). Liver Function Tests: Recent Labs  Lab 10/18/19 1112  AST 36  ALT 28  ALKPHOS 85  BILITOT 0.8  PROT 6.8  ALBUMIN 4.2   HbA1C: Recent Labs    10/19/19 0612  HGBA1C 6.3*   CBG: No results for input(s): GLUCAP in the last 168 hours. Lipid  Profile: Recent Labs    10/20/19 0617  CHOL 177  HDL 56  LDLCALC 104*  TRIG 84  CHOLHDL 3.2   Thyroid Function Tests: Recent Labs    10/18/19 1312  TSH 2.079   Anemia Panel: No results for input(s): VITAMINB12, FOLATE, FERRITIN, TIBC, IRON, RETICCTPCT in the last 72 hours. Sepsis Labs: Recent Labs  Lab 10/19/19 0640  PROCALCITON 0.37    Recent Results (from the past 240 hour(s))  Respiratory Panel by RT PCR (Flu A&B, Covid) - Nasopharyngeal Swab     Status: None   Collection Time: 10/18/19 12:04 PM   Specimen: Nasopharyngeal Swab  Result Value Ref Range Status   SARS Coronavirus 2 by RT PCR NEGATIVE NEGATIVE Final    Comment: (NOTE) SARS-CoV-2 target nucleic acids are NOT DETECTED. The SARS-CoV-2 RNA is generally detectable in upper respiratoy specimens during the acute phase of infection. The lowest concentration of SARS-CoV-2 viral copies this assay can detect is 131 copies/mL. A negative result does not preclude SARS-Cov-2 infection and should not be used as the sole basis for treatment or other patient management decisions. A negative result may occur with  improper specimen collection/handling, submission of specimen other than nasopharyngeal swab, presence of viral mutation(s) within the  areas targeted by this assay, and inadequate number of viral copies (<131 copies/mL). A negative result must be combined with clinical observations, patient history, and epidemiological information. The expected result is Negative. Fact Sheet for Patients:  https://www.moore.com/ Fact Sheet for Healthcare Providers:  https://www.young.biz/ This test is not yet ap proved or cleared by the [place] FDA and  has been authorized for detection and/or diagnosis of SARS-CoV-2 by FDA under an Emergency Use Authorization (EUA). This EUA will remain  in effect (meaning this test can be used) for the duration of the COVID-19 declaration under  Section 564(b)(1) of the Act, 21 U.S.C. section 360bbb-3(b)(1), unless the authorization is terminated or revoked sooner.    Influenza A by PCR NEGATIVE NEGATIVE Final   Influenza B by PCR NEGATIVE NEGATIVE Final    Comment: (NOTE) The Xpert Xpress SARS-CoV-2/FLU/RSV assay is intended as an aid in  the diagnosis of influenza from Nasopharyngeal swab specimens and  should not be used as a sole basis for treatment. Nasal washings and  aspirates are unacceptable for Xpert Xpress SARS-CoV-2/FLU/RSV  testing. Fact Sheet for Patients: https://www.moore.com/ Fact Sheet for Healthcare Providers: https://www.young.biz/ This test is not yet approved or cleared by the [place] FDA and  has been authorized for detection and/or diagnosis of SARS-CoV-2 by  FDA under an Emergency Use Authorization (EUA). This EUA will remain  in effect (meaning this test can be used) for the duration of the  Covid-19 declaration under Section 564(b)(1) of the Act, 21  U.S.C. section 360bbb-3(b)(1), unless the authorization is  terminated or revoked. Performed at [facility], [address], [place], [place] [address]          Radiology Studies: ECHOCARDIOGRAM COMPLETE  Result Date: 10/19/2019    ECHOCARDIOGRAM REPORT   Patient Name:   CHRISTIANN HAGERTY Date of Exam: 10/19/2019 Medical Rec #:  [id number]      Height:       60.0 in Accession #:    [id number]     Weight:       80.5 lb Date of Birth:  10/29/1933      BSA:          1.268 m Patient Age:    86 years       BP:           145/73 mmHg Patient Gender: F              HR:           69 bpm. Exam Location:  Inpatient Procedure: 2D Echo, Cardiac Doppler and Color Doppler Indications:    Elevated troponin  History:        Patient has no prior history of Echocardiogram examinations.                 Prior CABG; Risk Factors:Hypertension.  Sonographer:    Ross Ludwig RDCS (AE) Referring Phys: [id number] Heloise Beecham [facility] IMPRESSIONS  1.  Global hypokinesis with akinesis of the inferolateral wall and apex; overall severely reduced LV systolic function; grade 1 diastolic dysfunction; moderate LVH; trace AI; mild MR; mild LAE.  2. Left ventricular ejection fraction, by estimation, is 25 to 30%. The left ventricle has severely decreased function. The left ventricle demonstrates regional wall motion abnormalities (see scoring diagram/findings for description). There is moderate left ventricular hypertrophy. Left ventricular diastolic parameters are consistent with Grade I diastolic dysfunction (impaired relaxation).  3. Right ventricular systolic function is normal. The right ventricular size is normal. There is  normal pulmonary artery systolic pressure.  4. Left atrial size was mildly dilated.  5. The mitral valve is normal in structure. Mild mitral valve regurgitation. No evidence of mitral stenosis.  6. The aortic valve is tricuspid. Aortic valve regurgitation is trivial. Mild to moderate aortic valve sclerosis/calcification is present, without any evidence of aortic stenosis.  7. The inferior vena cava is normal in size with greater than 50% respiratory variability, suggesting right atrial pressure of 3 mmHg. FINDINGS  Left Ventricle: Left ventricular ejection fraction, by estimation, is 25 to 30%. The left ventricle has severely decreased function. The left ventricle demonstrates regional wall motion abnormalities. The left ventricular internal cavity size was normal  in size. There is moderate left ventricular hypertrophy. Left ventricular diastolic parameters are consistent with Grade I diastolic dysfunction (impaired relaxation). Right Ventricle: The right ventricular size is normal. Right ventricular systolic function is normal. There is normal pulmonary artery systolic pressure. The tricuspid regurgitant velocity is 2.71 m/s, and with an assumed right atrial pressure of 3 mmHg,  the estimated right ventricular systolic pressure is 32.4 mmHg.  Left Atrium: Left atrial size was mildly dilated. Right Atrium: Right atrial size was normal in size. Pericardium: There is no evidence of pericardial effusion. Mitral Valve: The mitral valve is normal in structure. Normal mobility of the mitral valve leaflets. Mild mitral valve regurgitation. No evidence of mitral valve stenosis. MV peak gradient, 2.2 mmHg. The mean mitral valve gradient is 1.0 mmHg. Tricuspid Valve: The tricuspid valve is normal in structure. Tricuspid valve regurgitation is trivial. No evidence of tricuspid stenosis. Aortic Valve: The aortic valve is tricuspid. Aortic valve regurgitation is trivial. Aortic regurgitation PHT measures 452 msec. Mild to moderate aortic valve sclerosis/calcification is present, without any evidence of aortic stenosis. Aortic valve mean gradient measures 2.0 mmHg. Aortic valve peak gradient measures 3.8 mmHg. Aortic valve area, by VTI measures 1.68 cm. Pulmonic Valve: The pulmonic valve was not well visualized. Pulmonic valve regurgitation is not visualized. No evidence of pulmonic stenosis. Aorta: The aortic root is normal in size and structure. Venous: The inferior vena cava is normal in size with greater than 50% respiratory variability, suggesting right atrial pressure of 3 mmHg. IAS/Shunts: No atrial level shunt detected by color flow Doppler. Additional Comments: Global hypokinesis with akinesis of the inferolateral wall and apex; overall severely reduced LV systolic function; grade 1 diastolic dysfunction; moderate LVH; trace AI; mild MR; mild LAE.  LEFT VENTRICLE PLAX 2D LVIDd:         4.70 cm  Diastology LVIDs:         4.50 cm  LV e' lateral:   5.31 cm/s LV PW:         1.50 cm  LV E/e' lateral: 10.8 LV IVS:        1.90 cm  LV e' medial:    3.75 cm/s LVOT diam:     1.80 cm  LV E/e' medial:  15.3 LV SV:         35 LV SV Index:   27 LVOT Area:     2.54 cm  RIGHT VENTRICLE            IVC RV Basal diam:  3.30 cm    IVC diam: 1.40 cm RV S prime:     9.87 cm/s  TAPSE (M-mode): 1.8 cm LEFT ATRIUM             Index       RIGHT ATRIUM  Index LA diam:        3.90 cm 3.08 cm/m  RA Area:     13.30 cm LA Vol (A2C):   45.9 ml 36.21 ml/m RA Volume:   30.90 ml  24.38 ml/m LA Vol (A4C):   44.8 ml 35.34 ml/m LA Biplane Vol: 45.8 ml 36.13 ml/m  AORTIC VALVE AV Area (Vmax):    1.72 cm AV Area (Vmean):   1.70 cm AV Area (VTI):     1.68 cm AV Vmax:           97.00 cm/s AV Vmean:          71.700 cm/s AV VTI:            0.206 m AV Peak Grad:      3.8 mmHg AV Mean Grad:      2.0 mmHg LVOT Vmax:         65.40 cm/s LVOT Vmean:        47.900 cm/s LVOT VTI:          0.136 m LVOT/AV VTI ratio: 0.66 AI PHT:            452 msec  AORTA Ao Root diam: 3.10 cm Ao Asc diam:  3.20 cm MITRAL VALVE               TRICUSPID VALVE MV Area (PHT): 2.80 cm    TR Peak grad:   29.4 mmHg MV Peak grad:  2.2 mmHg    TR Vmax:        271.00 cm/s MV Mean grad:  1.0 mmHg MV Vmax:       0.75 m/s    SHUNTS MV Vmean:      51.1 cm/s   Systemic VTI:  0.14 m MV Decel Time: 271 msec    Systemic Diam: 1.80 cm MR Peak grad: 100.0 mmHg MR Mean grad: 63.0 mmHg MR Vmax:      500.00 cm/s MR Vmean:     370.0 cm/s MV E velocity: 57.40 cm/s MV A velocity: 71.10 cm/s MV E/A ratio:  0.81 Kirk Ruths MD Electronically signed by Kirk Ruths MD Signature Date/Time: 10/19/2019/2:51:32 PM    Final         Scheduled Meds: . aspirin EC  81 mg Oral Daily  . atorvastatin  40 mg Oral q1800  . carvedilol  6.25 mg Oral BID WC  . doxycycline  100 mg Oral Q12H  . enoxaparin (LOVENOX) injection  30 mg Subcutaneous Q24H  . furosemide  40 mg Oral Once  . losartan  25 mg Oral Daily  . predniSONE  40 mg Oral Q breakfast   Continuous Infusions: . sodium chloride 250 mL (10/19/19 1013)     LOS: 2 days    Time spent: 35 minutes    Deatra James, MD [facility]  If 7PM-7AM, please contact night-coverage [email or web address] 10/21/2019, 9:39 AM

## 2019-10-22 ENCOUNTER — Inpatient Hospital Stay (HOSPITAL_COMMUNITY): Payer: Medicare Other

## 2019-10-22 DIAGNOSIS — I509 Heart failure, unspecified: Secondary | ICD-10-CM | POA: Diagnosis not present

## 2019-10-22 DIAGNOSIS — J9601 Acute respiratory failure with hypoxia: Secondary | ICD-10-CM | POA: Diagnosis not present

## 2019-10-22 LAB — BASIC METABOLIC PANEL
Anion gap: 10 (ref 5–15)
BUN: 31 mg/dL — ABNORMAL HIGH (ref 8–23)
CO2: 28 mmol/L (ref 22–32)
Calcium: 8.8 mg/dL — ABNORMAL LOW (ref 8.9–10.3)
Chloride: 100 mmol/L (ref 98–111)
Creatinine, Ser: 0.97 mg/dL (ref 0.44–1.00)
GFR calc Af Amer: >60 mL/min (ref >60)
GFR calc non Af Amer: 54 mL/min — ABNORMAL LOW (ref >60)
Glucose, Bld: 98 mg/dL (ref 70–99)
Potassium: 3.4 mmol/L — ABNORMAL LOW (ref 3.5–5.1)
Sodium: 138 mmol/L (ref 135–145)

## 2019-10-22 LAB — BRAIN NATRIURETIC PEPTIDE: B Natriuretic Peptide: 593.3 pg/mL — ABNORMAL HIGH (ref 0.0–100.0)

## 2019-10-22 MED ORDER — LEVOTHYROXINE SODIUM 75 MCG PO TABS
75.0000 ug | ORAL_TABLET | Freq: Every day | ORAL | Status: DC
  Administered 2019-10-23 – 2019-10-25 (×3): 75 ug via ORAL
  Filled 2019-10-22 (×3): qty 1

## 2019-10-22 MED ORDER — SACUBITRIL-VALSARTAN 24-26 MG PO TABS
1.0000 | ORAL_TABLET | Freq: Two times a day (BID) | ORAL | Status: DC
  Administered 2019-10-22 (×2): 1 via ORAL
  Filled 2019-10-22 (×3): qty 1

## 2019-10-22 NOTE — Care Management Important Message (Signed)
Important Message  Patient Details  Name: Chelsea Goodman MRN: [id number] Date of Birth: 03-02-1934   Medicare Important Message Given:  Yes     Kennedie, P[place]oe 10/22/2019, 10:22 AM

## 2019-10-22 NOTE — Plan of Care (Signed)
  Problem: Clinical Measurements: Goal: Diagnostic test results will improve Outcome: Progressing   Problem: Safety: Goal: Ability to remain free from injury will improve Outcome: Progressing   Problem: Activity: Goal: Capacity to carry out activities will improve Outcome: Progressing

## 2019-10-22 NOTE — TOC Progression Note (Addendum)
Transition of Care [facility]) - Progression Note    Patient Details  Name: ALEXXA SABET MRN: [id number] Date of Birth: 05-Jan-1934  Transition of Care [facility]) CM/SW Contact  Leone Haven, RN Phone Number: 10/22/2019, 3:44 PM  Clinical Narrative:    NCM lvm for daughter to call back, if patient is going home with her will need her address to set up HHPT.  Awaiting call back. NCM received call back from daughter , Victorino Dike, she states she would like a hospital bed for patient and rolling walker.  NCM made referral to [facility] with [employer] for delivery for hospital bed to address [address] [place] [address] (daughter's address) phone [phone].  NCM spoke with Dr. Maryfrances Bunnell he states patient will also need home oxygen.  NCM informed Zach with [employer] of this information.  NCM offered choice to Victorino Dike she states she does not have a preference for the [facility] agency.  NCM made referral to [facility], for [facility], HHPT, awaiting call back.         Expected Discharge Plan and Services                                                 Social Determinants of Health (SDOH) Interventions    Readmission Risk Interventions No flowsheet data found.

## 2019-10-22 NOTE — Progress Notes (Signed)
SATURATION QUALIFICATIONS: (This note is used to comply with regulatory documentation for home oxygen)  Patient Saturations on Room Air at Rest = 93%  Patient Saturations on Room Air while Ambulating = 89%  Patient Saturations on 2 Liters of oxygen while Ambulating = 92%  Please briefly explain why patient needs home oxygen:  Pt needs for oxygen increases after walking for more than 2-3 minutes. Pt did not show any signs of labored breathing, discomfort, or chest pain. Pt did state fatigue after completion of walk. Pt ambulated with a front wheel walker, and minimal assist required for ambulation.

## 2019-10-22 NOTE — TOC Benefit Eligibility Note (Signed)
Transition of Care [facility]) Benefit Eligibility Note    Patient Details  Name: Chelsea Goodman MRN: [id number] Date of Birth: Feb 27, 1934   Medication/Dose: Sherryll Burger 24-26 MG BID  Covered?: Yes  Tier: 3 Drug  Prescription Coverage Preferred Pharmacy: [facility]  Spoke with Person/Company/Phone Number:: NIA    @   [employer]  #  [phone]  Co-Pay: $47.00  Prior Approval: No  Deductible: Unmet       Mardene Sayer Phone Number: 10/22/2019, 3:29 PM

## 2019-10-22 NOTE — Progress Notes (Signed)
Progress Note  Patient Name: Chelsea Goodman Date of Encounter: 10/22/2019  Primary Cardiologist: No primary care provider on file.   Subjective   Stress test showed large area of infarct involving left ventricular apex but no definitive ischemia. Patient denies chest pain. Breathing is better but still on1LO2 and has some coughing. Family member is asking about prognosis of patient.   Inpatient Medications    Scheduled Meds: . aspirin EC  81 mg Oral Daily  . atorvastatin  40 mg Oral q1800  . carvedilol  6.25 mg Oral BID WC  . doxycycline  100 mg Oral Q12H  . enoxaparin (LOVENOX) injection  30 mg Subcutaneous Q24H  . losartan  25 mg Oral Daily  . predniSONE  40 mg Oral Q breakfast   Continuous Infusions: . sodium chloride 250 mL (10/19/19 1013)   PRN Meds: sodium chloride, acetaminophen **OR** acetaminophen, ipratropium-albuterol, labetalol, ondansetron **OR** ondansetron (ZOFRAN) IV, polyethylene glycol   Vital Signs    Vitals:   10/21/19 1807 10/21/19 2052 10/22/19 0200 10/22/19 0457  BP: 117/61 120/65  (!) 145/68  Pulse: 64 (!) 52  (!) 57  Resp:  20  19  Temp:  97.6 F (36.4 C)  97.9 F (36.6 C)  TempSrc:  Oral  Oral  SpO2:  94%  93%  Weight:   35.7 kg   Height:        Intake/Output Summary (Last 24 hours) at 10/22/2019 0826 Last data filed at 10/22/2019 0509 Gross per 24 hour  Intake 840 ml  Output 300 ml  Net 540 ml   Last 3 Weights 10/22/2019 10/21/2019 10/20/2019  Weight (lbs) 78 lb 12.8 oz 79 lb 3.2 oz 78 lb 14.4 oz  Weight (kg) 35.743 kg 35.925 kg 35.789 kg      Telemetry    N/A - Personally Reviewed  ECG    No new - Personally Reviewed  Physical Exam   GEN: No acute distress.   Neck: No JVD Cardiac: RRR, no murmurs, rubs, or gallops.  Respiratory: diminished diffusely with wheezing GI: Soft, nontender, non-distended  MS: No edema; No deformity. Neuro:  Nonfocal  Psych: Normal affect   Labs    High Sensitivity Troponin:   Recent Labs  Lab  10/18/19 1513 10/18/19 1846 10/18/19 2111 10/19/19 0640 10/19/19 0827  TROPONINIHS 219* 331* 410* 272* 206*      Chemistry Recent Labs  Lab 10/18/19 1112 10/19/19 0612 10/21/19 0505 10/21/19 0711 10/22/19 0613  NA 136   < > 137 135 138  K 4.1   < > 3.5 3.7 3.4*  CL 99   < > 98 99 100  CO2 26   < > 29 28 28   GLUCOSE 213*   < > 104* 107* 98  BUN 14   < > 33* 34* 31*  CREATININE 0.86   < > 1.01* 0.92 0.97  CALCIUM 8.3*   < > 9.0 8.9 8.8*  PROT 6.8  --   --   --   --   ALBUMIN 4.2  --   --   --   --   AST 36  --   --   --   --   ALT 28  --   --   --   --   ALKPHOS 85  --   --   --   --   BILITOT 0.8  --   --   --   --   GFRNONAA >60   < >  51* 57* 54*  GFRAA >60   < > 59* >60 >60  ANIONGAP 11   < > 10 8 10    < > = values in this interval not displayed.     Hematology Recent Labs  Lab 10/18/19 1112 10/19/19 0612 10/20/19 0617  WBC 14.9* 10.0 9.1  RBC 4.16 4.09 4.04  HGB 14.0 13.5 13.4  HCT 44.5 40.6 40.5  MCV 107.0* 99.3 100.2*  MCH 33.7 33.0 33.2  MCHC 31.5 33.3 33.1  RDW 13.4 13.2 13.3  PLT 165 152 160    BNP Recent Labs  Lab 10/20/19 0617 10/21/19 0505 10/21/19 0711  BNP 942.1* 851.4* 988.1*     DDimer No results for input(s): DDIMER in the last 168 hours.   Radiology    NM Myocar Multi W/Spect W/Wall Motion / EF  Result Date: 10/21/2019 CLINICAL DATA:  Chest pain. History of CAD, post median sternotomy and CABG. EXAM: MYOCARDIAL IMAGING WITH SPECT (REST AND PHARMACOLOGIC-STRESS) GATED LEFT VENTRICULAR WALL MOTION STUDY LEFT VENTRICULAR EJECTION FRACTION TECHNIQUE: Standard myocardial SPECT imaging was performed after resting intravenous injection of 10 mCi Tc-10m tetrofosmin. Subsequently, intravenous infusion of Lexiscan was performed under the supervision of the Cardiology staff. At peak effect of the drug, 30 mCi Tc-3m tetrofosmin was injected intravenously and standard myocardial SPECT imaging was performed. Quantitative gated imaging was also  performed to evaluate left ventricular wall motion, and estimate left ventricular ejection fraction. COMPARISON:  Chest radiograph-10/18/2019; chest CT-10/18/2019 FINDINGS: Raw images: Radiotracer is seen about the right upper extremity IV injection site. A minimal amount of GI attenuation is seen, worse on the provided rest images. There is no significant patient motion artifact. There is no significant chest wall attenuation. Perfusion: Matched large area of non perfusion involving the left ventricular apex suggestive of previous infarction. There are no mismatched perfusion defects on the provided stress images to suggest pharmacologically induced ischemia. Wall Motion: The left ventricle is moderately dilated. There is global hypokinesia with relative akinesia involving the septum. Left Ventricular Ejection Fraction: 27 % End diastolic volume 562 ml End systolic volume 91 ml IMPRESSION: 1. Suspected large territory infarct involving the left ventricular apex. 2. No definitive scintigraphic evidence of pharmacologically induced ischemia. 3. Moderate left ventricular dilatation with global hypokinesia and relative akinesia involving the septum. 4. Ejection fraction - 27%. Electronically Signed   By: Sandi Mariscal M.D.   On: 10/21/2019 12:32    Cardiac Studies    Echo 10/19/19: IMPRESSIONS   1. Global hypokinesis with akinesis of the inferolateral wall and apex;  overall severely reduced LV systolic function; grade 1 diastolic  dysfunction; moderate LVH; trace AI; mild MR; mild LAE.  2. Left ventricular ejection fraction, by estimation, is 25 to 30%. The  left ventricle has severely decreased function. The left ventricle  demonstrates regional wall motion abnormalities (see scoring  diagram/findings for description). There is moderate  left ventricular hypertrophy. Left ventricular diastolic parameters are  consistent with Grade I diastolic dysfunction (impaired relaxation).  3. Right ventricular  systolic function is normal. The right ventricular  size is normal. There is normal pulmonary artery systolic pressure.  4. Left atrial size was mildly dilated.  5. The mitral valve is normal in structure. Mild mitral valve  regurgitation. No evidence of mitral stenosis.  6. The aortic valve is tricuspid. Aortic valve regurgitation is trivial.  Mild to moderate aortic valve sclerosis/calcification is present, without  any evidence of aortic stenosis.  7. The inferior vena cava is normal in  size with greater than 50%  respiratory variability, suggesting right atrial pressure of 3 mmHg.   Myoview stress test IMPRESSION: 1. Suspected large territory infarct involving the left ventricular apex. 2. No definitive scintigraphic evidence of pharmacologically induced ischemia. 3. Moderate left ventricular dilatation with global hypokinesia and relative akinesia involving the septum. 4. Ejection fraction - 27%.   Patient Profile     86 y.o. female  with CAD s/p CABG (2001), hypertension, hyperlipidemia, and COPD admitted with acute systolic and diastolic heart failure  Assessment & Plan    Acute diastolic HF - BNP up to 1,700 - EF 25-30% with global hypokinesis and akinesis of the inferolateral wal and aped - ACE held with plan to start Entresto - Metoprolol switched to Coreg - renal function is stable - patient transitioned to oral Lasix 40 mg yesterday. She is not on lasix at home. She is euvolemic on exam. - Stress test showed suspected large territory infarct involving the left ventricular apex but no definitive ischemia. Will not likely go for cath. Will discuss with MD.  AKI - resolved - oral lasix 40 mg given yesterday  HTN - coreg and losartan - pressures reasonable  CAD s/p CABG - HS trop elevated to 410 with downward trend - patient denies chest pain - Stress test as above - continue aspirin  HLD - atorvastatin 40 mg daily - LDL 104  Weakness - PT  recommends 24 hour care and home PT  COPD exacerbation - per IM - wean off O2  For questions or updates, please contact [facility] Please consult [email or web address] for contact info under        Signed, Alvaro Aungst David Stall, PA-C  10/22/2019, 8:26 AM

## 2019-10-22 NOTE — Progress Notes (Signed)
Modified Barium Swallow Progress Note  Patient Details  Name: Chelsea Goodman MRN: [id number] Date of Birth: 1933-08-28  Today's Date: 10/22/2019  Modified Barium Swallow completed.  Full report located under Chart Review in the Imaging Section.  Brief recommendations include the following:  Clinical Impression  Pt was seen in radiology suite for modified barium swallow study. Trials were limited to thin liquids and puree solids. Pt requested that the study be terminated since she found the barium to be noxious and was fearful of needing to vomit after the initial trials. Pt's oropharyngeal swallow mechanism was within functional limits with inconsistent premature spillage to the pyriform sinuses. Esophageal screening revealed retention of barium in the milddle to lower thoracic esophagus with backflow. Instances of penetration/aspiration were not demonstrated during the study but the possibility of backflow reaching the cervical esophagus/pharynx and ultimately leading to aspiration is considered. Further skilled SLP services are not clinically indicated at this time. Assessment of the esophageal phase of the swallow may be beneficial but the pt's willingness to consume barium for an additional assessment is questioned.    Swallow Evaluation Recommendations   Recommended Consults: Consider esophageal assessment(However pt likely will not tolerate barium)   SLP Diet Recommendations: Regular solids;Thin liquid   Liquid Administration via: Cup;Straw   Medication Administration: Whole meds with liquid   Supervision: Patient able to self feed   Compensations: Slow rate;Small sips/bites;Follow solids with liquid   Postural Changes: Seated upright at 90 degrees;Remain semi-upright after after feeds/meals (Comment)   Oral Care Recommendations: Oral care BID      Chelsea Goodman I. Vear Clock, MS, CCC-SLP Acute Rehabilitation Services Office number [phone] Pager [phone]   Chelsea Goodman 10/22/2019,12:03 PM

## 2019-10-22 NOTE — Progress Notes (Signed)
Physical Therapy Treatment Patient Details Name: Chelsea Goodman MRN: [id number] DOB: July 24, 1933 Today's Date: 10/22/2019    History of Present Illness Chelsea Goodman is a 86 y.o. female with medical history significant for CABG, hypertension.  Brought to the ED via EMS for reports of difficulty breathing, on EMS arrival patient was walking around the house confused, O2 sats 84% on room air.  Was placed on nasal cannula and O2 sats improved to 100%.At the time of my evaluation patient is awake alert and oriented.  She does not remember the episode of confusion. She has been having difficulty breathing ongoing over the past week.    PT Comments    Pt in bed upon arrival of PT, agreeable to PT session with focus on progressing ambulation tolerance. The pt was able to demo good progression of ambulation in the room with use of RW and seated rest between 2, 15 ft bouts. The pt had no LOB but continues to rely on BUE support to maintain stability with mobility. The pt remains significantly limited by fatigue, reporting modified borg RPE of 9/10 (very, very intense) following ambulation in room. The pt will continue to benefit from skilled PT to further progress endurance and safety with functional mobility and transfers.     Follow Up Recommendations  Home health PT;Supervision for mobility/OOB     Equipment Recommendations  Rolling walker with 5" wheels    Recommendations for Other Services       Precautions / Restrictions Precautions Precautions: Fall Precaution Comments: mod fall, watch SpO2 Restrictions Weight Bearing Restrictions: No    Mobility  Bed Mobility Overal bed mobility: Needs Assistance Bed Mobility: Supine to Sit;Sit to Supine     Supine to sit: Min assist Sit to supine: Min assist   General bed mobility comments: minA with repeated VCs for sequencing and hand positioning to reach sitting EOB  Transfers Overall transfer level: Needs assistance Equipment used:  Rolling walker (2 wheeled) Transfers: Sit to/from Stand Sit to Stand: Min guard         General transfer comment: minG to rise from bed with use of RW and cues for hand positioning. completed x3 through session  Ambulation/Gait Ambulation/Gait assistance: Min guard Gait Distance (Feet): 15 Feet(15 x 2) Assistive device: Rolling walker (2 wheeled) Gait Pattern/deviations: Step-through pattern;Decreased stride length Gait velocity: decr Gait velocity interpretation: <1.31 ft/sec, indicative of household ambulator General Gait Details: cues for positioning with RW, posture, and clearance. No LOB with use of RW, but pt fatigues after 15 ft.   Stairs             Wheelchair Mobility    Modified Rankin (Stroke Patients Only)       Balance Overall balance assessment: Needs assistance Sitting-balance support: Feet supported Sitting balance-Leahy Scale: Good     Standing balance support: Single extremity supported;Bilateral upper extremity supported;During functional activity Standing balance-Leahy Scale: Fair Standing balance comment: pt able to ambulate with RW without physical assist                            Cognition Arousal/Alertness: Awake/alert Behavior During Therapy: WFL for tasks assessed/performed Overall Cognitive Status: Within Functional Limits for tasks assessed                                 General Comments: Pt with some instances of not hearing commands, requiring  additional cues. Benefits from cues for hand positioning and sequencing.      Exercises General Exercises - Lower Extremity Long Arc Quad: AROM;Strengthening;Both;15 reps;Seated(5 AROM, 10 against manual resistance for both knee ext and flexion) Heel Raises: Strengthening;Both;15 reps;Seated    General Comments General comments (skin integrity, edema, etc.): pt reports 9/10 fatigue following 2 bouts of 15 ft walking with RW. SpO2 in 90s with 1L O2       Pertinent Vitals/Pain Pain Assessment: Faces Faces Pain Scale: Hurts a little bit Pain Location: R hip Pain Descriptors / Indicators: Aching;Sore;Grimacing Pain Intervention(s): Monitored during session;Repositioned    Home Living                      Prior Function            PT Goals (current goals can now be found in the care plan section) Acute Rehab PT Goals Patient Stated Goal: to get stronger PT Goal Formulation: With patient Time For Goal Achievement: 11/03/19 Potential to Achieve Goals: Good Progress towards PT goals: Progressing toward goals    Frequency    Min 3X/week      PT Plan Current plan remains appropriate    Co-evaluation              AM-PAC PT "6 Clicks" Mobility   Outcome Measure  Help needed turning from your back to your side while in a flat bed without using bedrails?: A Little Help needed moving from lying on your back to sitting on the side of a flat bed without using bedrails?: A Little Help needed moving to and from a bed to a chair (including a wheelchair)?: A Little Help needed standing up from a chair using your arms (e.g., wheelchair or bedside chair)?: A Little Help needed to walk in hospital room?: A Little Help needed climbing 3-5 steps with a railing? : A Lot 6 Click Score: 17    End of Session Equipment Utilized During Treatment: Gait belt;Oxygen Activity Tolerance: Patient limited by fatigue Patient left: in bed;with call bell/phone within reach;with family/visitor present Nurse Communication: Mobility status PT Visit Diagnosis: Unsteadiness on feet (R26.81);Other abnormalities of gait and mobility (R26.89);Muscle weakness (generalized) (M62.81);Difficulty in walking, not elsewhere classified (R26.2)     Time: 0867-6195 PT Time Calculation (min) (ACUTE ONLY): 41 min  Charges:  $Gait Training: 23-37 mins $Therapeutic Activity: 8-22 mins                     Karma Ganja, PT, DPT   Acute Rehabilitation  Department Pager #: [phone]   Otho Bellows 10/22/2019, 1:16 PM

## 2019-10-22 NOTE — Progress Notes (Signed)
  Speech Language Pathology Treatment: Dysphagia  Patient Details Name: Chelsea Goodman MRN: [id number] DOB: 1934/03/08 Today's Date: 10/22/2019 Time: 5929-2446 SLP Time Calculation (min) (ACUTE ONLY): 13 min  Assessment / Plan / Recommendation Clinical Impression  Pt was seen for dysphagia treatment with her daughter present. Pt and daughter denied signs of aspiration with p.o. intake. Pt's daughter reported that the pt has demonstrated progressive weight loss for months. No s/sx of aspiration were noted with trials of thin liquids via straw or with regular texture solids. A modified barium swallow study is recommended to rule out silent aspiration and is scheduled for today at 10:30.    HPI HPI: Chelsea Goodman is a 86 y.o. female with medical history significant for COPD, CABG, hypertension admitted for reports of difficulty breathing. per char patient reports recently she has been having some difficulty swallowing especially her pills. Chest x-ray shows chronic findings suggestive of COPD.  CTA chest shows small pleural effusions, basilar atelectasis. Small amount of aspirate material in bilateral proximal bronchi.      SLP Plan  MBS       Recommendations  Diet recommendations: Regular;Thin liquid Liquids provided via: Cup;Straw Medication Administration: Whole meds with liquid(if large whole in puree) Supervision: Patient able to self feed Compensations: Slow rate;Small sips/bites                Oral Care Recommendations: Oral care BID Follow up Recommendations: None SLP Visit Diagnosis: Dysphagia, unspecified (R13.10) Plan: MBS       Matea Stanard I. Vear Clock, MS, CCC-SLP Acute Rehabilitation Services Office number [phone] Pager [phone]       [name] 10/22/2019, 10:10 AM

## 2019-10-22 NOTE — Progress Notes (Signed)
    Durable Medical Equipment  (From admission, onward)         Start     Ordered   10/22/19 1558  For home use only DME Hospital bed  Once    Question Answer Comment  Length of Need Lifetime   Patient has (list medical condition): CHF   The above medical condition requires: Patient requires the ability to reposition frequently   Head must be elevated greater than: 30 degrees   Bed type Semi-electric   Support Surface: Gel Overlay      10/22/19 1557   10/22/19 1553  For home use only DME Walker rolling  Once    Question Answer Comment  Walker: With 5 Inch Wheels   Patient needs a walker to treat with the following condition Weakness      10/22/19 1553

## 2019-10-22 NOTE — Progress Notes (Signed)
PROGRESS NOTE    Chelsea Goodman  UKG:[id number] DOB: 10/10/1933 DOA: 10/18/2019 PCP: Patient, No Pcp Per      Brief Narrative:  Chelsea Goodman is a 86 y.o. F with hx CAD s/p CABG 2001, COPD, and dCHF who presented with progressive dyspnea on exertion, cough, and fatigue for several weeks.  Blood pressure 200 systolic, BNP 1792, troponin elevated, chest x-ray showed COPD and CT angiogram of the chest showed small pleural effusions, aspirate material in the proximal bronchi.  Admitted and started on Lasix and antibiotics.  Echocardiogram showed new ejection fraction 25 to 30%, global hypokinesis.  Nuclear perfusion study ordered in follow up, showed old infarct.      Assessment & Plan:  Acute hypoxic respiratory failure Due to CHF, COPD, possible aspiration pneumonia.   Acute on chronic systolic and diastolic CHF Coronary disease, secondary prevention Appears euvolemic, however still on oxygen, still not at baseline with respect to dyspnea with exertion. -Continue baby aspirin, atorvastatin, carvedilol -Hold Lasix given Entresto  COPD with exacerbation Improving, but still on supplemental oxygen -Continue doxycycline, bronchodilators -Continue prednisone  Hypertension Blood pressure controlled -Continue Entresto  AKI ruled out  Prediabetes  Hypothyroidism -Resume home levothyroxine       Disposition: Status is: Inpatient  Remains inpatient appropriate because: Patient presented with severe respiratory failure due to CHF, COPD, and aspiration pneumonia, and she remains on new supplemental oxygen which we are attempting to wean.  She still remains significantly dyspneic from baseline.   Dispo: The patient is from: Home              Anticipated d/c is to: Home with home health              Anticipated d/c date is: 1 day              Patient currently is not medically stable to d/c.               MDM: The below labs and imaging reports were reviewed  and summarized above.  Medication management as above.  This is a severe exacerbation of her chronic illness, and an acute illness that poses a threat to life and bodily function.      DVT prophylaxis: Lovenox Code Status: Partial code Family Communication: Daughter at the bedside    Consultants:   Cardiology  Procedures:     Antimicrobials:      Culture data:              Subjective: Patient still extremely weak and tired.  She is dyspneic at rest.  She has no swelling, no orthopnea, no chest pain, no fever.  No confusion.  Objective: Vitals:   10/22/19 1000 10/22/19 1136 10/22/19 1305 10/22/19 1625  BP: (!) 110/59 125/71  93/78  Pulse: (!) 54 (!) 55 65 67  Resp:  17  16  Temp: 97.9 F (36.6 C) 97.7 F (36.5 C)  98.4 F (36.9 C)  TempSrc: Oral Oral  Oral  SpO2: 92% 96% 98% 92%  Weight:      Height:        Intake/Output Summary (Last 24 hours) at 10/22/2019 1714 Last data filed at 10/22/2019 0900 Gross per 24 hour  Intake 360 ml  Output 300 ml  Net 60 ml   Filed Weights   10/20/19 0524 10/21/19 0346 10/22/19 0200  Weight: 35.8 kg 35.9 kg 35.7 kg    Examination: General appearance:  adult female, alert and in no acute distress.  appears extremely tired HEENT: Anicteric, conjunctiva pink, lids and lashes normal. No nasal deformity, discharge, epistaxis.  Lips moist.   Skin: Warm and dry.   No suspicious rashes or lesions. Cardiac: RRR, nl S1-S2, no murmurs appreciated.  Capillary refill is brisk.  JVPnot visible.  No LE edema.  Radial pulses 2+ and symmetric. Respiratory: Normal respiratory rate and rhythm.  Appears dyspneic with exertion.  Nasal cannula in place.  Lung sounds diminished bilaterally.  No wheezing. Abdomen: Abdomen soft.  No TTP or guarding. No ascites, distension, hepatosplenomegaly.   MSK: No deformities or effusions. Neuro: Awake and alert.  EOMI, moves all extremities with severe generalized weakness. Speech fluent.    Psych:  Sensorium intact and responding to questions, attention normal. Affect flat.  Judgment and insight appear normal.    Data Reviewed: I have personally reviewed following labs and imaging studies:  CBC: Recent Labs  Lab 10/18/19 1112 10/19/19 0612 10/20/19 0617  WBC 14.9* 10.0 9.1  NEUTROABS 12.9*  --   --   HGB 14.0 13.5 13.4  HCT 44.5 40.6 40.5  MCV 107.0* 99.3 100.2*  PLT 165 152 160   Basic Metabolic Panel: Recent Labs  Lab 10/19/19 0612 10/20/19 0617 10/21/19 0505 10/21/19 0711 10/22/19 0613  NA 138 136 137 135 138  K 3.8 3.6 3.5 3.7 3.4*  CL 98 97* 98 99 100  CO2 28 28 29 28 28   GLUCOSE 91 110* 104* 107* 98  BUN 14 27* 33* 34* 31*  CREATININE 1.10* 1.24* 1.01* 0.92 0.97  CALCIUM 8.9 9.0 9.0 8.9 8.8*  MG  --  1.9  --   --   --    GFR: Estimated Creatinine Clearance: 24.3 mL/min (by C-G formula based on SCr of 0.97 mg/dL). Liver Function Tests: Recent Labs  Lab 10/18/19 1112  AST 36  ALT 28  ALKPHOS 85  BILITOT 0.8  PROT 6.8  ALBUMIN 4.2   No results for input(s): LIPASE, AMYLASE in the last 168 hours. No results for input(s): AMMONIA in the last 168 hours. Coagulation Profile: No results for input(s): INR, PROTIME in the last 168 hours. Cardiac Enzymes: No results for input(s): CKTOTAL, CKMB, CKMBINDEX, TROPONINI in the last 168 hours. BNP (last 3 results) No results for input(s): PROBNP in the last 8760 hours. HbA1C: No results for input(s): HGBA1C in the last 72 hours. CBG: No results for input(s): GLUCAP in the last 168 hours. Lipid Profile: Recent Labs    10/20/19 0617  CHOL 177  HDL 56  LDLCALC 104*  TRIG 84  CHOLHDL 3.2   Thyroid Function Tests: No results for input(s): TSH, T4TOTAL, FREET4, T3FREE, THYROIDAB in the last 72 hours. Anemia Panel: No results for input(s): VITAMINB12, FOLATE, FERRITIN, TIBC, IRON, RETICCTPCT in the last 72 hours. Urine analysis:    Component Value Date/Time   COLORURINE YELLOW 10/18/2019 1229    APPEARANCEUR CLEAR 10/18/2019 1229   LABSPEC 1.012 10/18/2019 1229   PHURINE 5.0 10/18/2019 1229   GLUCOSEU 150 (A) 10/18/2019 1229   HGBUR MODERATE (A) 10/18/2019 1229   BILIRUBINUR NEGATIVE 10/18/2019 1229   KETONESUR NEGATIVE 10/18/2019 1229   PROTEINUR 100 (A) 10/18/2019 1229   NITRITE NEGATIVE 10/18/2019 1229   LEUKOCYTESUR NEGATIVE 10/18/2019 1229   Sepsis Labs: @LABRCNTIP (procalcitonin:4,lacticacidven:4)  ) Recent Results (from the past 240 hour(s))  Respiratory Panel by RT PCR (Flu A&B, Covid) - Nasopharyngeal Swab     Status: None   Collection Time: 10/18/19 12:04 PM   Specimen: Nasopharyngeal Swab  Result Value Ref Range Status   SARS Coronavirus 2 by RT PCR NEGATIVE NEGATIVE Final    Comment: (NOTE) SARS-CoV-2 target nucleic acids are NOT DETECTED. The SARS-CoV-2 RNA is generally detectable in upper respiratoy specimens during the acute phase of infection. The lowest concentration of SARS-CoV-2 viral copies this assay can detect is 131 copies/mL. A negative result does not preclude SARS-Cov-2 infection and should not be used as the sole basis for treatment or other patient management decisions. A negative result may occur with  improper specimen collection/handling, submission of specimen other than nasopharyngeal swab, presence of viral mutation(s) within the areas targeted by this assay, and inadequate number of viral copies (<131 copies/mL). A negative result must be combined with clinical observations, patient history, and epidemiological information. The expected result is Negative. Fact Sheet for Patients:  https://www.moore.com/ Fact Sheet for Healthcare Providers:  https://www.young.biz/ This test is not yet ap proved or cleared by the [place] FDA and  has been authorized for detection and/or diagnosis of SARS-CoV-2 by FDA under an Emergency Use Authorization (EUA). This EUA will remain  in effect (meaning this  test can be used) for the duration of the COVID-19 declaration under Section 564(b)(1) of the Act, 21 U.S.C. section 360bbb-3(b)(1), unless the authorization is terminated or revoked sooner.    Influenza A by PCR NEGATIVE NEGATIVE Final   Influenza B by PCR NEGATIVE NEGATIVE Final    Comment: (NOTE) The Xpert Xpress SARS-CoV-2/FLU/RSV assay is intended as an aid in  the diagnosis of influenza from Nasopharyngeal swab specimens and  should not be used as a sole basis for treatment. Nasal washings and  aspirates are unacceptable for Xpert Xpress SARS-CoV-2/FLU/RSV  testing. Fact Sheet for Patients: https://www.moore.com/ Fact Sheet for Healthcare Providers: https://www.young.biz/ This test is not yet approved or cleared by the [place] FDA and  has been authorized for detection and/or diagnosis of SARS-CoV-2 by  FDA under an Emergency Use Authorization (EUA). This EUA will remain  in effect (meaning this test can be used) for the duration of the  Covid-19 declaration under Section 564(b)(1) of the Act, 21  U.S.C. section 360bbb-3(b)(1), unless the authorization is  terminated or revoked. Performed at [facility], [address], [place], [place] [address]          Radiology Studies: NM Myocar Multi W/Spect Izetta Dakin Motion / EF  Result Date: 10/21/2019 CLINICAL DATA:  Chest pain. History of CAD, post median sternotomy and CABG. EXAM: MYOCARDIAL IMAGING WITH SPECT (REST AND PHARMACOLOGIC-STRESS) GATED LEFT VENTRICULAR WALL MOTION STUDY LEFT VENTRICULAR EJECTION FRACTION TECHNIQUE: Standard myocardial SPECT imaging was performed after resting intravenous injection of 10 mCi Tc-40m tetrofosmin. Subsequently, intravenous infusion of Lexiscan was performed under the supervision of the Cardiology staff. At peak effect of the drug, 30 mCi Tc-70m tetrofosmin was injected intravenously and standard myocardial SPECT imaging was performed. Quantitative  gated imaging was also performed to evaluate left ventricular wall motion, and estimate left ventricular ejection fraction. COMPARISON:  Chest radiograph-10/18/2019; chest CT-10/18/2019 FINDINGS: Raw images: Radiotracer is seen about the right upper extremity IV injection site. A minimal amount of GI attenuation is seen, worse on the provided rest images. There is no significant patient motion artifact. There is no significant chest wall attenuation. Perfusion: Matched large area of non perfusion involving the left ventricular apex suggestive of previous infarction. There are no mismatched perfusion defects on the provided stress images to suggest pharmacologically induced ischemia. Wall Motion: The left ventricle is moderately dilated. There is global  hypokinesia with relative akinesia involving the septum. Left Ventricular Ejection Fraction: 27 % End diastolic volume 381 ml End systolic volume 91 ml IMPRESSION: 1. Suspected large territory infarct involving the left ventricular apex. 2. No definitive scintigraphic evidence of pharmacologically induced ischemia. 3. Moderate left ventricular dilatation with global hypokinesia and relative akinesia involving the septum. 4. Ejection fraction - 27%. Electronically Signed   By: Sandi Mariscal M.D.   On: 10/21/2019 12:32   DG Swallowing Func-Speech Pathology  Result Date: 10/22/2019 Objective Swallowing Evaluation: Type of Study: MBS-Modified Barium Swallow Study  Patient Details Name: RABIA ARGOTE MRN: [id number] Date of Birth: March 24, 1934 Today's Date: 10/22/2019 Time: SLP Start Time (ACUTE ONLY): 5277 -SLP Stop Time (ACUTE ONLY): 1058 SLP Time Calculation (min) (ACUTE ONLY): 18 min Past Medical History: Past Medical History: Diagnosis Date . CHF (congestive heart failure) ([place])  . COPD (chronic obstructive pulmonary disease) ([place])  . Hypertension  Past Surgical History: Past Surgical History: Procedure Laterality Date . CORONARY ARTERY BYPASS GRAFT   HPI: ANQUANETTE BAHNER  is a 86 y.o. female with medical history significant for COPD, CABG, hypertension admitted for reports of difficulty breathing. per char patient reports recently she has been having some difficulty swallowing especially her pills. Chest x-ray shows chronic findings suggestive of COPD.  CTA chest shows small pleural effusions, basilar atelectasis. Small amount of aspirate material in bilateral proximal bronchi.  No data recorded Assessment / Plan / Recommendation CHL IP CLINICAL IMPRESSIONS 10/22/2019 Clinical Impression Pt was seen in radiology suite for modified barium swallow study. Trials were limited to thin liquids and puree solids. Pt requested that the study be terminated since she found the barium to be noxious and was fearful of needing to vomit after the initial trials. Pt's oropharyngeal swallow mechanism was within functional limits with inconsistent premature spillage to the pyriform sinuses. Esophageal screening revealed retention of barium in the milddle to lower thoracic esophagus with backflow. Instances of penetration/aspiration were not demonstrated during the study but the possibility of backflow reaching the cervical esophagus/pharynx and ultimately leading to aspiration is considered. Further skilled SLP services are not clinically indicated at this time. Assessment of the esophageal phase of the swallow may be beneficial but the pt's willingness to consume barium for an additional assessment is questioned.  SLP Visit Diagnosis Dysphagia, unspecified (R13.10) Attention and concentration deficit following -- Frontal lobe and executive function deficit following -- Impact on safety and function Mild aspiration risk   CHL IP TREATMENT RECOMMENDATION 10/22/2019 Treatment Recommendations No treatment recommended at this time   Prognosis 10/22/2019 Prognosis for Safe Diet Advancement Good Barriers to Reach Goals -- Barriers/Prognosis Comment -- CHL IP DIET RECOMMENDATION 10/22/2019 SLP Diet Recommendations  Regular solids;Thin liquid Liquid Administration via Cup;Straw Medication Administration Whole meds with liquid Compensations Slow rate;Small sips/bites;Follow solids with liquid Postural Changes Seated upright at 90 degrees;Remain semi-upright after after feeds/meals (Comment)   CHL IP OTHER RECOMMENDATIONS 10/22/2019 Recommended Consults Consider esophageal assessment Oral Care Recommendations Oral care BID Other Recommendations --   CHL IP FOLLOW UP RECOMMENDATIONS 10/22/2019 Follow up Recommendations None   CHL IP FREQUENCY AND DURATION 10/19/2019 Speech Therapy Frequency (ACUTE ONLY) min 1 x/week Treatment Duration 1 week      CHL IP ORAL PHASE 10/22/2019 Oral Phase WFL Oral - Pudding Teaspoon -- Oral - Pudding Cup -- Oral - Honey Teaspoon -- Oral - Honey Cup -- Oral - Nectar Teaspoon -- Oral - Nectar Cup -- Oral - Nectar Straw -- Oral - Thin  Teaspoon -- Oral - Thin Cup -- Oral - Thin Straw -- Oral - Puree -- Oral - Mech Soft -- Oral - Regular -- Oral - Multi-Consistency -- Oral - Pill -- Oral Phase - Comment --  CHL IP PHARYNGEAL PHASE 10/22/2019 Pharyngeal Phase WFL Pharyngeal- Pudding Teaspoon -- Pharyngeal -- Pharyngeal- Pudding Cup -- Pharyngeal -- Pharyngeal- Honey Teaspoon -- Pharyngeal -- Pharyngeal- Honey Cup -- Pharyngeal -- Pharyngeal- Nectar Teaspoon -- Pharyngeal -- Pharyngeal- Nectar Cup -- Pharyngeal -- Pharyngeal- Nectar Straw -- Pharyngeal -- Pharyngeal- Thin Teaspoon -- Pharyngeal -- Pharyngeal- Thin Cup -- Pharyngeal -- Pharyngeal- Thin Straw -- Pharyngeal -- Pharyngeal- Puree -- Pharyngeal -- Pharyngeal- Mechanical Soft -- Pharyngeal -- Pharyngeal- Regular -- Pharyngeal -- Pharyngeal- Multi-consistency -- Pharyngeal -- Pharyngeal- Pill -- Pharyngeal -- Pharyngeal Comment --  CHL IP CERVICAL ESOPHAGEAL PHASE 10/22/2019 Cervical Esophageal Phase Impaired Pudding Teaspoon -- Pudding Cup -- Honey Teaspoon -- Honey Cup -- Nectar Teaspoon -- Nectar Cup -- Nectar Straw -- Thin Teaspoon -- Thin Cup -- Thin  Straw -- Puree -- Mechanical Soft -- Regular -- Multi-consistency -- Pill -- Cervical Esophageal Comment Retention of barium in thoracic esophagus with backflow Shanika I. Vear Clock, MS, CCC-SLP Acute Rehabilitation Services Office number [phone] Pager [phone] [name] 10/22/2019, 12:09 PM                   Scheduled Meds: . aspirin EC  81 mg Oral Daily  . atorvastatin  40 mg Oral q1800  . carvedilol  6.25 mg Oral BID WC  . doxycycline  100 mg Oral Q12H  . enoxaparin (LOVENOX) injection  30 mg Subcutaneous Q24H  . [START ON 10/23/2019] levothyroxine  75 mcg Oral Q0600  . predniSONE  40 mg Oral Q breakfast  . sacubitril-valsartan  1 tablet Oral BID   Continuous Infusions: . sodium chloride 250 mL (10/19/19 1013)     LOS: 3 days    Time spent: 35 minutes    Alberteen Sam, MD [facility] 10/22/2019, 5:14 PM     Please page though AMION or Epic secure chat:  For [employer], Higher education careers adviser

## 2019-10-23 DIAGNOSIS — J441 Chronic obstructive pulmonary disease with (acute) exacerbation: Secondary | ICD-10-CM

## 2019-10-23 DIAGNOSIS — I5043 Acute on chronic combined systolic (congestive) and diastolic (congestive) heart failure: Secondary | ICD-10-CM

## 2019-10-23 DIAGNOSIS — R5381 Other malaise: Secondary | ICD-10-CM

## 2019-10-23 DIAGNOSIS — R0902 Hypoxemia: Secondary | ICD-10-CM

## 2019-10-23 LAB — BASIC METABOLIC PANEL
Anion gap: 7 (ref 5–15)
BUN: 32 mg/dL — ABNORMAL HIGH (ref 8–23)
CO2: 30 mmol/L (ref 22–32)
Calcium: 8.8 mg/dL — ABNORMAL LOW (ref 8.9–10.3)
Chloride: 102 mmol/L (ref 98–111)
Creatinine, Ser: 1.08 mg/dL — ABNORMAL HIGH (ref 0.44–1.00)
GFR calc Af Amer: 55 mL/min — ABNORMAL LOW (ref >60)
GFR calc non Af Amer: 47 mL/min — ABNORMAL LOW (ref >60)
Glucose, Bld: 121 mg/dL — ABNORMAL HIGH (ref 70–99)
Potassium: 3.6 mmol/L (ref 3.5–5.1)
Sodium: 139 mmol/L (ref 135–145)

## 2019-10-23 LAB — CBC
HCT: 43.7 % (ref 36.0–46.0)
Hemoglobin: 14.4 g/dL (ref 12.0–15.0)
MCH: 33.1 pg (ref 26.0–34.0)
MCHC: 33 g/dL (ref 30.0–36.0)
MCV: 100.5 fL — ABNORMAL HIGH (ref 80.0–100.0)
Platelets: 170 10*3/uL (ref 150–400)
RBC: 4.35 MIL/uL (ref 3.87–5.11)
RDW: 13.2 % (ref 11.5–15.5)
WBC: 9.7 10*3/uL (ref 4.0–10.5)
nRBC: 0 % (ref 0.0–0.2)

## 2019-10-23 LAB — BRAIN NATRIURETIC PEPTIDE: B Natriuretic Peptide: 714.6 pg/mL — ABNORMAL HIGH (ref 0.0–100.0)

## 2019-10-23 MED ORDER — LOSARTAN POTASSIUM 25 MG PO TABS
12.5000 mg | ORAL_TABLET | Freq: Every day | ORAL | Status: DC
  Administered 2019-10-24 – 2019-10-25 (×2): 12.5 mg via ORAL
  Filled 2019-10-23 (×2): qty 1

## 2019-10-23 MED ORDER — CARVEDILOL 3.125 MG PO TABS
3.1250 mg | ORAL_TABLET | Freq: Two times a day (BID) | ORAL | Status: DC
  Administered 2019-10-23 – 2019-10-24 (×3): 3.125 mg via ORAL
  Filled 2019-10-23 (×4): qty 1

## 2019-10-23 NOTE — Progress Notes (Signed)
Progress Note  Patient Name: Chelsea Goodman Date of Encounter: 10/23/2019  Primary Cardiologist: Olga Millers, MD   Subjective   Pressures soft this AM after Coreg. Hold entresto this AM. Patient denies dizziness of lightheadedness. No chest pain. Feels breathing is improving, on 2LO2 but has taken it off some and felt fine.   Plan for patient to go home and live with her daughter at discharge. Might be sent home with oxygen.   Inpatient Medications    Scheduled Meds: . aspirin EC  81 mg Oral Daily  . atorvastatin  40 mg Oral q1800  . carvedilol  6.25 mg Oral BID WC  . doxycycline  100 mg Oral Q12H  . enoxaparin (LOVENOX) injection  30 mg Subcutaneous Q24H  . levothyroxine  75 mcg Oral Q0600  . predniSONE  40 mg Oral Q breakfast  . sacubitril-valsartan  1 tablet Oral BID   Continuous Infusions: . sodium chloride 250 mL (10/19/19 1013)   PRN Meds: sodium chloride, acetaminophen **OR** acetaminophen, ipratropium-albuterol, labetalol, ondansetron **OR** ondansetron (ZOFRAN) IV, polyethylene glycol   Vital Signs    Vitals:   10/23/19 0551 10/23/19 0910 10/23/19 0911 10/23/19 0926  BP: 105/62  (!) 80/46 (!) 76/42  Pulse: 60 (!) 55 (!) 54   Resp: (!) 21     Temp: 97.6 F (36.4 C) 97.8 F (36.6 C) 97.8 F (36.6 C)   TempSrc: Oral Oral Oral   SpO2: 91% 93% 93%   Weight:      Height:        Intake/Output Summary (Last 24 hours) at 10/23/2019 0936 Last data filed at 10/23/2019 0900 Gross per 24 hour  Intake 696 ml  Output --  Net 696 ml   Last 3 Weights 10/23/2019 10/22/2019 10/21/2019  Weight (lbs) 78 lb 78 lb 12.8 oz 79 lb 3.2 oz  Weight (kg) 35.381 kg 35.743 kg 35.925 kg      Telemetry    N/A - Personally Reviewed  ECG    No new - Personally Reviewed  Physical Exam   GEN: No acute distress.   Neck: No JVD Cardiac: RRR, no murmurs, rubs, or gallops.  Respiratory: minimal wheezing, 2LO2 GI: Soft, nontender, non-distended  MS: No edema; No deformity. Neuro:   Nonfocal  Psych: Normal affect   Labs    High Sensitivity Troponin:   Recent Labs  Lab 10/18/19 1513 10/18/19 1846 10/18/19 2111 10/19/19 0640 10/19/19 0827  TROPONINIHS 219* 331* 410* 272* 206*      Chemistry Recent Labs  Lab 10/18/19 1112 10/19/19 0612 10/21/19 0711 10/22/19 0613 10/23/19 0459  NA 136   < > 135 138 139  K 4.1   < > 3.7 3.4* 3.6  CL 99   < > 99 100 102  CO2 26   < > 28 28 30   GLUCOSE 213*   < > 107* 98 121*  BUN 14   < > 34* 31* 32*  CREATININE 0.86   < > 0.92 0.97 1.08*  CALCIUM 8.3*   < > 8.9 8.8* 8.8*  PROT 6.8  --   --   --   --   ALBUMIN 4.2  --   --   --   --   AST 36  --   --   --   --   ALT 28  --   --   --   --   ALKPHOS 85  --   --   --   --  BILITOT 0.8  --   --   --   --   GFRNONAA >60   < > 57* 54* 47*  GFRAA >60   < > >60 >60 55*  ANIONGAP 11   < > 8 10 7    < > = values in this interval not displayed.     Hematology Recent Labs  Lab 10/19/19 0612 10/20/19 0617 10/23/19 0459  WBC 10.0 9.1 9.7  RBC 4.09 4.04 4.35  HGB 13.5 13.4 14.4  HCT 40.6 40.5 43.7  MCV 99.3 100.2* 100.5*  MCH 33.0 33.2 33.1  MCHC 33.3 33.1 33.0  RDW 13.2 13.3 13.2  PLT 152 160 170    BNP Recent Labs  Lab 10/21/19 0711 10/22/19 0613 10/23/19 0459  BNP 988.1* 593.3* 714.6*     DDimer No results for input(s): DDIMER in the last 168 hours.   Radiology    NM Myocar Multi W/Spect W/Wall Motion / EF  Result Date: 10/21/2019 CLINICAL DATA:  Chest pain. History of CAD, post median sternotomy and CABG. EXAM: MYOCARDIAL IMAGING WITH SPECT (REST AND PHARMACOLOGIC-STRESS) GATED LEFT VENTRICULAR WALL MOTION STUDY LEFT VENTRICULAR EJECTION FRACTION TECHNIQUE: Standard myocardial SPECT imaging was performed after resting intravenous injection of 10 mCi Tc-29m tetrofosmin. Subsequently, intravenous infusion of Lexiscan was performed under the supervision of the Cardiology staff. At peak effect of the drug, 30 mCi Tc-33m tetrofosmin was injected  intravenously and standard myocardial SPECT imaging was performed. Quantitative gated imaging was also performed to evaluate left ventricular wall motion, and estimate left ventricular ejection fraction. COMPARISON:  Chest radiograph-10/18/2019; chest CT-10/18/2019 FINDINGS: Raw images: Radiotracer is seen about the right upper extremity IV injection site. A minimal amount of GI attenuation is seen, worse on the provided rest images. There is no significant patient motion artifact. There is no significant chest wall attenuation. Perfusion: Matched large area of non perfusion involving the left ventricular apex suggestive of previous infarction. There are no mismatched perfusion defects on the provided stress images to suggest pharmacologically induced ischemia. Wall Motion: The left ventricle is moderately dilated. There is global hypokinesia with relative akinesia involving the septum. Left Ventricular Ejection Fraction: 27 % End diastolic volume 125 ml End systolic volume 91 ml IMPRESSION: 1. Suspected large territory infarct involving the left ventricular apex. 2. No definitive scintigraphic evidence of pharmacologically induced ischemia. 3. Moderate left ventricular dilatation with global hypokinesia and relative akinesia involving the septum. 4. Ejection fraction - 27%. Electronically Signed   By: 12/18/2019 M.D.   On: 10/21/2019 12:32   DG Swallowing Func-Speech Pathology  Result Date: 10/22/2019 Objective Swallowing Evaluation: Type of Study: MBS-Modified Barium Swallow Study  Patient Details Name: Chelsea Goodman MRN: Lear Ng Date of Birth: Aug 16, 1933 Today's Date: 10/22/2019 Time: SLP Start Time (ACUTE ONLY): 1040 -SLP Stop Time (ACUTE ONLY): 1058 SLP Time Calculation (min) (ACUTE ONLY): 18 min Past Medical History: Past Medical History: Diagnosis Date . CHF (congestive heart failure) (HCC)  . COPD (chronic obstructive pulmonary disease) (HCC)  . Hypertension  Past Surgical History: Past Surgical History:  Procedure Laterality Date . CORONARY ARTERY BYPASS GRAFT   HPI: Chelsea Goodman is a 86 y.o. female with medical history significant for COPD, CABG, hypertension admitted for reports of difficulty breathing. per char patient reports recently she has been having some difficulty swallowing especially her pills. Chest x-ray shows chronic findings suggestive of COPD.  CTA chest shows small pleural effusions, basilar atelectasis. Small amount of aspirate material in bilateral proximal bronchi.  No  data recorded Assessment / Plan / Recommendation CHL IP CLINICAL IMPRESSIONS 10/22/2019 Clinical Impression Pt was seen in radiology suite for modified barium swallow study. Trials were limited to thin liquids and puree solids. Pt requested that the study be terminated since she found the barium to be noxious and was fearful of needing to vomit after the initial trials. Pt's oropharyngeal swallow mechanism was within functional limits with inconsistent premature spillage to the pyriform sinuses. Esophageal screening revealed retention of barium in the milddle to lower thoracic esophagus with backflow. Instances of penetration/aspiration were not demonstrated during the study but the possibility of backflow reaching the cervical esophagus/pharynx and ultimately leading to aspiration is considered. Further skilled SLP services are not clinically indicated at this time. Assessment of the esophageal phase of the swallow may be beneficial but the pt's willingness to consume barium for an additional assessment is questioned.  SLP Visit Diagnosis Dysphagia, unspecified (R13.10) Attention and concentration deficit following -- Frontal lobe and executive function deficit following -- Impact on safety and function Mild aspiration risk   CHL IP TREATMENT RECOMMENDATION 10/22/2019 Treatment Recommendations No treatment recommended at this time   Prognosis 10/22/2019 Prognosis for Safe Diet Advancement Good Barriers to Reach Goals --  Barriers/Prognosis Comment -- CHL IP DIET RECOMMENDATION 10/22/2019 SLP Diet Recommendations Regular solids;Thin liquid Liquid Administration via Cup;Straw Medication Administration Whole meds with liquid Compensations Slow rate;Small sips/bites;Follow solids with liquid Postural Changes Seated upright at 90 degrees;Remain semi-upright after after feeds/meals (Comment)   CHL IP OTHER RECOMMENDATIONS 10/22/2019 Recommended Consults Consider esophageal assessment Oral Care Recommendations Oral care BID Other Recommendations --   CHL IP FOLLOW UP RECOMMENDATIONS 10/22/2019 Follow up Recommendations None   CHL IP FREQUENCY AND DURATION 10/19/2019 Speech Therapy Frequency (ACUTE ONLY) min 1 x/week Treatment Duration 1 week      CHL IP ORAL PHASE 10/22/2019 Oral Phase WFL Oral - Pudding Teaspoon -- Oral - Pudding Cup -- Oral - Honey Teaspoon -- Oral - Honey Cup -- Oral - Nectar Teaspoon -- Oral - Nectar Cup -- Oral - Nectar Straw -- Oral - Thin Teaspoon -- Oral - Thin Cup -- Oral - Thin Straw -- Oral - Puree -- Oral - Mech Soft -- Oral - Regular -- Oral - Multi-Consistency -- Oral - Pill -- Oral Phase - Comment --  CHL IP PHARYNGEAL PHASE 10/22/2019 Pharyngeal Phase WFL Pharyngeal- Pudding Teaspoon -- Pharyngeal -- Pharyngeal- Pudding Cup -- Pharyngeal -- Pharyngeal- Honey Teaspoon -- Pharyngeal -- Pharyngeal- Honey Cup -- Pharyngeal -- Pharyngeal- Nectar Teaspoon -- Pharyngeal -- Pharyngeal- Nectar Cup -- Pharyngeal -- Pharyngeal- Nectar Straw -- Pharyngeal -- Pharyngeal- Thin Teaspoon -- Pharyngeal -- Pharyngeal- Thin Cup -- Pharyngeal -- Pharyngeal- Thin Straw -- Pharyngeal -- Pharyngeal- Puree -- Pharyngeal -- Pharyngeal- Mechanical Soft -- Pharyngeal -- Pharyngeal- Regular -- Pharyngeal -- Pharyngeal- Multi-consistency -- Pharyngeal -- Pharyngeal- Pill -- Pharyngeal -- Pharyngeal Comment --  CHL IP CERVICAL ESOPHAGEAL PHASE 10/22/2019 Cervical Esophageal Phase Impaired Pudding Teaspoon -- Pudding Cup -- Honey Teaspoon -- Honey Cup  -- Nectar Teaspoon -- Nectar Cup -- Nectar Straw -- Thin Teaspoon -- Thin Cup -- Thin Straw -- Puree -- Mechanical Soft -- Regular -- Multi-consistency -- Pill -- Cervical Esophageal Comment Retention of barium in thoracic esophagus with backflow Shanika I. Hardin Negus, Page, [place] Office number [phone] Pager [place] 10/22/2019, 12:09 PM               Cardiac Studies   Echo 10/19/19: IMPRESSIONS  1. Global hypokinesis with akinesis of the inferolateral wall and apex;  overall severely reduced LV systolic function; grade 1 diastolic  dysfunction; moderate LVH; trace AI; mild MR; mild LAE.  2. Left ventricular ejection fraction, by estimation, is 25 to 30%. The  left ventricle has severely decreased function. The left ventricle  demonstrates regional wall motion abnormalities (see scoring  diagram/findings for description). There is moderate  left ventricular hypertrophy. Left ventricular diastolic parameters are  consistent with Grade I diastolic dysfunction (impaired relaxation).  3. Right ventricular systolic function is normal. The right ventricular  size is normal. There is normal pulmonary artery systolic pressure.  4. Left atrial size was mildly dilated.  5. The mitral valve is normal in structure. Mild mitral valve  regurgitation. No evidence of mitral stenosis.  6. The aortic valve is tricuspid. Aortic valve regurgitation is trivial.  Mild to moderate aortic valve sclerosis/calcification is present, without  any evidence of aortic stenosis.  7. The inferior vena cava is normal in size with greater than 50%  respiratory variability, suggesting right atrial pressure of 3 mmHg.   Myoview stress test IMPRESSION: 1. Suspected large territory infarct involving the left ventricular apex. 2. No definitive scintigraphic evidence of pharmacologically induced ischemia. 3. Moderate left ventricular dilatation with global  hypokinesia and relative akinesia involving the septum. 4. Ejection fraction - 27%.  Patient Profile     86 y.o. female with CAD s/p CABG (2001), hypertension, hyperlipidemia, and COPD admitted with acute systolic and diastolic heart failure.  Assessment & Plan    Acute diastolic HF - BNP up to 1,700 - EF 25-30% with global hypokinesis and akinesis of the inferolateral wal and aped - Patient was on IV lasix however was held due to stable volume status. Will not likely need more lasix. Suspect sob also due to COPD - Stress test showed suspected large territory infarct involving the left ventricular apex but no definitive ischemia. No further ischemic work-up. - ACE held and Entresto started yesterday - Metoprolol switched to Coreg - renal function is stable  AKI - resolved - entresto started - creatinine today 1.08 - will need follow-up labs  HTN - coreg and Entresto - pressures soft this morning after Coreg - will decrease BB and hold Entresto. Will discuss with  Dr. Duke Salvia medication  CAD s/p CABG - HS trop elevated to 410 with downward trend - patient denies chest pain - Stress test as above - continue aspirin  HLD - atorvastatin 40 mg daily - LDL 104  Weakness - PT recommends 24 hour care and home PT - Plan to go live with her daughter  COPD exacerbation - per IM - wean off O2 as tolerated  For questions or updates, please contact [facility] Please consult [email or web address] for contact info under        Signed, Ernestyne Caldwell David Stall, PA-C  10/23/2019, 9:36 AM

## 2019-10-23 NOTE — Progress Notes (Signed)
Notified cardiology

## 2019-10-23 NOTE — TOC Progression Note (Addendum)
Transition of Care [place]) - Progression Note    Patient Details  Name: Chelsea Goodman MRN: [id number] Date of Birth: 11-17-1933  [facility] CM/SW Contact  Leone Haven, RN Phone Number: 10/23/2019, 9:51 AM  Clinical Narrative:    NCM still awaiting to hear back from [facility] regarding referral for HHPT, HHRN,  Left another message for a return call, patient has rolling walker and oxygen in the room.  Per her daughter her PCP will be [facility], Dr. Carlena Sax, she will have an apt next week. Per [facility] they do not have the staff for referral.  NCM  Spoke with daughter and they will have the [facility] office set up the [facility] services when patient has follow up with them, because they will have to have a MD to sign off on the orders. NCM asked MD to request [facility] services in the dc summary once she has her apt with new PCP.        Expected Discharge Plan and Services                                                 Social Determinants of Health (SDOH) Interventions    Readmission Risk Interventions No flowsheet data found.

## 2019-10-23 NOTE — Progress Notes (Signed)
PROGRESS NOTE    Chelsea Goodman  XLK:[id number] DOB: Aug 09, 1933 DOA: 10/18/2019 PCP: Patient, No Pcp Per   Brief Narrative:   Brief/Interim Summary: As per HPI:  86 y.o. female with medical history significant for CABG, hypertension.  Brought to the ED via EMS for reports of difficulty breathing, on EMS arrival patient was walking around the house confused, O2 sats 84% on room air.  Was placed on nasal cannula and O2 sats improved to 100%. At the time of my evaluation patient is awake alert and oriented.  She does not remember the episode of confusion. She has been having difficulty breathing ongoing over the past week. Patient has a remote history of smoking, over 40 years ago.  But she over the past 20 years she has been using a wood stove at home.  Over the past month the [employer] has being malfunctioning, patient's family has noticed the house smelling more of smoke, but this significantly worsened over the past 2 weeks, with smoke lingering in the air.  Patient's daughter had rattling in patient's chest over the past 2 weeks.  Patient is unaware of rattling or wheezing.  She has a chronic unchanged cough. No chest pain, no leg swelling, no weight gain, no bloating. Patient reports recently she has been having some difficulty swallowing especially her pills.  ED Course: Blood pressure elevated initially 200 systolic, improved to 130s with resuming home antihypertensives, temperature 97.2, tachypneic.  WBC 14.9.  BNP markedly elevated at 1792.  Troponin 43 >> 118.  UA moderate hemoglobin not suggestive of UTI.  pH 7.38, PCO2 45, PO2 78. Chest x-ray shows chronic findings suggestive of COPD.  CTA chest shows small pleural effusions, basilar atelectasis.  Small amount of aspirate material in bilateral proximal bronchi. EDP talked to cardiology recommended admission to [facility], so that cardiology can see in consult.  IV Lasix 40 mg x 1 given.  Hospitalist admited for acute respiratory  failure  Patient was admitted and treated for acute systolic and diastolic heart failure, AKI and acute hypoxic respiratory failure due to COPD CHF and possible aspiration pneumonia. Patient was seen by cardiology, underwent nuclear stress test that showed prior infarct without significant ischemia.  Cardiology plan for medical management Entresto 24/26 twice daily started continued carvedilol but holding Lasix due to inconsistent oral intake/tenuous renal dysfunction.  By speech eval underwent modified barium swallow but did not complete the esophageal portion as patient was hesitant to swallow the barium due to fear of nausea/vomiting and SLP ADVISED advised regular diet.  Subjective Sleepy, daughter at bedside. BP soft this am- pt sleepy, but abel to interact. No chest pain  appears dry.  Assessment plan Acute Hypoxic respiratory failure:Due to CHF, COPD, possible aspiration pneumonia: Patient will need oxygen on ambulation.  Overall clinically improving.  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  Seen by cardiology -stopping Entresto due to hypotension this morning Coreg dose cut down.  Monitor overnight.  Cont  aspirin Lipitor carvedilol.  Holding Lasix and can use as as needed.  Debility: PT OT speech eval completed and will need home health and will be going to daughter's house.  Hospital bed and walker will be arranged  COPD with acute exacerbation: Improving, able to come off at rest, will need oxygen on ambulation and case manager will arrange.  Continue doxycycline, bronchodilators and taper off prednisone  Hypertension:Blood pressure controlled. Continue Entresto  AKI ruled out  Prediabetes- monitor  glucose Lab Results  Component Value Date   HGBA1C 6.3 (H) 10/19/2019   Hypothyroidism:cont home levothyroxine   DVT prophylaxis:lovenox Code Status: Partial  Family Communication: plan of care discussed with patient and  family at bedside. Status is: Inpatient  Remains inpatient appropriate because:Hemodynamically unstable, will need monitoring of blood pressure after adjusting her medication-decrease in Coreg dose, stopping Entresto and for further trial of medication tomorrow morning.   Dispo: The patient is from: Home              Anticipated d/c is to: Home              Anticipated d/c date is: 1 day              Patient currently is not medically stable to d/c.  Nutrition: Diet Order            Diet Heart Room service appropriate? Yes; Fluid consistency: Thin  Diet effective now             Body mass index is 15.23 kg/m. Consultants:see note  Procedures:see note Microbiology:see note  Medications: Scheduled Meds: . aspirin EC  81 mg Oral Daily  . atorvastatin  40 mg Oral q1800  . carvedilol  3.125 mg Oral BID WC  . doxycycline  100 mg Oral Q12H  . enoxaparin (LOVENOX) injection  30 mg Subcutaneous Q24H  . levothyroxine  75 mcg Oral Q0600  . [START ON 10/24/2019] losartan  12.5 mg Oral Daily  . predniSONE  40 mg Oral Q breakfast   Continuous Infusions: . sodium chloride 250 mL (10/19/19 1013)    Antimicrobials: Anti-infectives (From admission, onward)   Start     Dose/Rate Route Frequency Ordered Stop   10/19/19 1215  doxycycline (VIBRA-TABS) tablet 100 mg     100 mg Oral Every 12 hours 10/19/19 1117     10/18/19 1730  Ampicillin-Sulbactam (UNASYN) 3 g in sodium chloride 0.9 % 100 mL IVPB  Status:  Discontinued     3 g 200 mL/hr over 30 Minutes Intravenous Every 8 hours 10/18/19 1713 10/19/19 1117       Objective: Vitals: Today's Vitals   10/23/19 0910 10/23/19 0911 10/23/19 0926 10/23/19 1122  BP:  (!) 80/46 (!) 76/42 (!) 97/59  Pulse: (!) 55 (!) 54  62  Resp:   18 16  Temp: 97.8 F (36.6 C) 97.8 F (36.6 C)  98.3 F (36.8 C)  TempSrc: Oral Oral  Oral  SpO2: 93% 93%  96%  Weight:      Height:      PainSc:        Intake/Output Summary (Last 24 hours) at 10/23/2019  1317 Last data filed at 10/23/2019 0900 Gross per 24 hour  Intake 696 ml  Output --  Net 696 ml   Filed Weights   10/21/19 0346 10/22/19 0200 10/23/19 0543  Weight: 35.9 kg 35.7 kg 35.4 kg   Weight change: -0.363 kg   Intake/Output from previous day: 05/05 0701 - 05/06 0700 In: 580 [P.O.:580] Out: -  Intake/Output this shift: Total I/O In: 236 [P.O.:236] Out: -   Examination:  General exam: AAOx3, weak and frail. HEENT:Oral mucosa moist, Ear/Nose WNL grossly,dentition normal. Respiratory system: bilaterally clear,no wheezing or crackles,no use of accessory muscle, non tender. Cardiovascular system: S1 & S2 +, regular, No JVD. Gastrointestinal system: Abdomen soft, NT,ND, BS+. Nervous System:Alert, awake, moving extremities and grossly nonfocal Extremities: No edema, distal peripheral pulses palpable.  Skin: No rashes,no icterus. MSK:  Normal muscle bulk,tone, power  Data Reviewed: I have personally reviewed following labs and imaging studies CBC: Recent Labs  Lab 10/18/19 1112 10/19/19 0612 10/20/19 0617 10/23/19 0459  WBC 14.9* 10.0 9.1 9.7  NEUTROABS 12.9*  --   --   --   HGB 14.0 13.5 13.4 14.4  HCT 44.5 40.6 40.5 43.7  MCV 107.0* 99.3 100.2* 100.5*  PLT 165 152 160 170   Basic Metabolic Panel: Recent Labs  Lab 10/20/19 0617 10/21/19 0505 10/21/19 0711 10/22/19 0613 10/23/19 0459  NA 136 137 135 138 139  K 3.6 3.5 3.7 3.4* 3.6  CL 97* 98 99 100 102  CO2 28 29 28 28 30   GLUCOSE 110* 104* 107* 98 121*  BUN 27* 33* 34* 31* 32*  CREATININE 1.24* 1.01* 0.92 0.97 1.08*  CALCIUM 9.0 9.0 8.9 8.8* 8.8*  MG 1.9  --   --   --   --    GFR: Estimated Creatinine Clearance: 21.7 mL/min (A) (by C-G formula based on SCr of 1.08 mg/dL (H)). Liver Function Tests: Recent Labs  Lab 10/18/19 1112  AST 36  ALT 28  ALKPHOS 85  BILITOT 0.8  PROT 6.8  ALBUMIN 4.2   No results for input(s): LIPASE, AMYLASE in the last 168 hours. No results for input(s): AMMONIA  in the last 168 hours. Coagulation Profile: No results for input(s): INR, PROTIME in the last 168 hours. Cardiac Enzymes: No results for input(s): CKTOTAL, CKMB, CKMBINDEX, TROPONINI in the last 168 hours. BNP (last 3 results) No results for input(s): PROBNP in the last 8760 hours. HbA1C: No results for input(s): HGBA1C in the last 72 hours. CBG: No results for input(s): GLUCAP in the last 168 hours. Lipid Profile: No results for input(s): CHOL, HDL, LDLCALC, TRIG, CHOLHDL, LDLDIRECT in the last 72 hours. Thyroid Function Tests: No results for input(s): TSH, T4TOTAL, FREET4, T3FREE, THYROIDAB in the last 72 hours. Anemia Panel: No results for input(s): VITAMINB12, FOLATE, FERRITIN, TIBC, IRON, RETICCTPCT in the last 72 hours. Sepsis Labs: Recent Labs  Lab 10/19/19 0640  PROCALCITON 0.37    Recent Results (from the past 240 hour(s))  Respiratory Panel by RT PCR (Flu A&B, Covid) - Nasopharyngeal Swab     Status: None   Collection Time: 10/18/19 12:04 PM   Specimen: Nasopharyngeal Swab  Result Value Ref Range Status   SARS Coronavirus 2 by RT PCR NEGATIVE NEGATIVE Final    Comment: (NOTE) SARS-CoV-2 target nucleic acids are NOT DETECTED. The SARS-CoV-2 RNA is generally detectable in upper respiratoy specimens during the acute phase of infection. The lowest concentration of SARS-CoV-2 viral copies this assay can detect is 131 copies/mL. A negative result does not preclude SARS-Cov-2 infection and should not be used as the sole basis for treatment or other patient management decisions. A negative result may occur with  improper specimen collection/handling, submission of specimen other than nasopharyngeal swab, presence of viral mutation(s) within the areas targeted by this assay, and inadequate number of viral copies (<131 copies/mL). A negative result must be combined with clinical observations, patient history, and epidemiological information. The expected result is  Negative. Fact Sheet for Patients:  12/18/19 Fact Sheet for Healthcare Providers:  https://www.moore.com/ This test is not yet ap proved or cleared by the https://www.young.biz/ FDA and  has been authorized for detection and/or diagnosis of SARS-CoV-2 by FDA under an Emergency Use Authorization (EUA). This EUA will remain  in effect (meaning this test can be used) for the duration of the  COVID-19 declaration under Section 564(b)(1) of the Act, 21 U.S.C. section 360bbb-3(b)(1), unless the authorization is terminated or revoked sooner.    Influenza A by PCR NEGATIVE NEGATIVE Final   Influenza B by PCR NEGATIVE NEGATIVE Final    Comment: (NOTE) The Xpert Xpress SARS-CoV-2/FLU/RSV assay is intended as an aid in  the diagnosis of influenza from Nasopharyngeal swab specimens and  should not be used as a sole basis for treatment. Nasal washings and  aspirates are unacceptable for Xpert Xpress SARS-CoV-2/FLU/RSV  testing. Fact Sheet for Patients: https://www.moore.com/ Fact Sheet for Healthcare Providers: https://www.young.biz/ This test is not yet approved or cleared by the [place] FDA and  has been authorized for detection and/or diagnosis of SARS-CoV-2 by  FDA under an Emergency Use Authorization (EUA). This EUA will remain  in effect (meaning this test can be used) for the duration of the  Covid-19 declaration under Section 564(b)(1) of the Act, 21  U.S.C. section 360bbb-3(b)(1), unless the authorization is  terminated or revoked. Performed at [facility], [address], [place], [place] [address]       Radiology Studies: DG Swallowing [facility] Pathology  Result Date: 10/22/2019 Objective Swallowing Evaluation: Type of Study: MBS-Modified Barium Swallow Study  Patient Details Name: Chelsea Goodman MRN: [id number] Date of Birth: 01-04-1934 Today's Date: 10/22/2019 Time: SLP Start Time (ACUTE ONLY):  1040 -SLP Stop Time (ACUTE ONLY): 1058 SLP Time Calculation (min) (ACUTE ONLY): 18 min Past Medical History: Past Medical History: Diagnosis Date . CHF (congestive heart failure) (HCC)  . COPD (chronic obstructive pulmonary disease) (HCC)  . Hypertension  Past Surgical History: Past Surgical History: Procedure Laterality Date . CORONARY ARTERY BYPASS GRAFT   HPI: Chelsea Goodman is a 86 y.o. female with medical history significant for COPD, CABG, hypertension admitted for reports of difficulty breathing. per char patient reports recently she has been having some difficulty swallowing especially her pills. Chest x-ray shows chronic findings suggestive of COPD.  CTA chest shows small pleural effusions, basilar atelectasis. Small amount of aspirate material in bilateral proximal bronchi.  No data recorded Assessment / Plan / Recommendation CHL IP CLINICAL IMPRESSIONS 10/22/2019 Clinical Impression Pt was seen in radiology suite for modified barium swallow study. Trials were limited to thin liquids and puree solids. Pt requested that the study be terminated since she found the barium to be noxious and was fearful of needing to vomit after the initial trials. Pt's oropharyngeal swallow mechanism was within functional limits with inconsistent premature spillage to the pyriform sinuses. Esophageal screening revealed retention of barium in the milddle to lower thoracic esophagus with backflow. Instances of penetration/aspiration were not demonstrated during the study but the possibility of backflow reaching the cervical esophagus/pharynx and ultimately leading to aspiration is considered. Further skilled SLP services are not clinically indicated at this time. Assessment of the esophageal phase of the swallow may be beneficial but the pt's willingness to consume barium for an additional assessment is questioned.  SLP Visit Diagnosis Dysphagia, unspecified (R13.10) Attention and concentration deficit following -- Frontal lobe  and executive function deficit following -- Impact on safety and function Mild aspiration risk   CHL IP TREATMENT RECOMMENDATION 10/22/2019 Treatment Recommendations No treatment recommended at this time   Prognosis 10/22/2019 Prognosis for Safe Diet Advancement Good Barriers to Reach Goals -- Barriers/Prognosis Comment -- CHL IP DIET RECOMMENDATION 10/22/2019 SLP Diet Recommendations Regular solids;Thin liquid Liquid Administration via Cup;Straw Medication Administration Whole meds with liquid Compensations Slow rate;Small sips/bites;Follow solids with liquid Postural Changes  Seated upright at 90 degrees;Remain semi-upright after after feeds/meals (Comment)   CHL IP OTHER RECOMMENDATIONS 10/22/2019 Recommended Consults Consider esophageal assessment Oral Care Recommendations Oral care BID Other Recommendations --   CHL IP FOLLOW UP RECOMMENDATIONS 10/22/2019 Follow up Recommendations None   CHL IP FREQUENCY AND DURATION 10/19/2019 Speech Therapy Frequency (ACUTE ONLY) min 1 x/week Treatment Duration 1 week      CHL IP ORAL PHASE 10/22/2019 Oral Phase WFL Oral - Pudding Teaspoon -- Oral - Pudding Cup -- Oral - Honey Teaspoon -- Oral - Honey Cup -- Oral - Nectar Teaspoon -- Oral - Nectar Cup -- Oral - Nectar Straw -- Oral - Thin Teaspoon -- Oral - Thin Cup -- Oral - Thin Straw -- Oral - Puree -- Oral - Mech Soft -- Oral - Regular -- Oral - Multi-Consistency -- Oral - Pill -- Oral Phase - Comment --  CHL IP PHARYNGEAL PHASE 10/22/2019 Pharyngeal Phase WFL Pharyngeal- Pudding Teaspoon -- Pharyngeal -- Pharyngeal- Pudding Cup -- Pharyngeal -- Pharyngeal- Honey Teaspoon -- Pharyngeal -- Pharyngeal- Honey Cup -- Pharyngeal -- Pharyngeal- Nectar Teaspoon -- Pharyngeal -- Pharyngeal- Nectar Cup -- Pharyngeal -- Pharyngeal- Nectar Straw -- Pharyngeal -- Pharyngeal- Thin Teaspoon -- Pharyngeal -- Pharyngeal- Thin Cup -- Pharyngeal -- Pharyngeal- Thin Straw -- Pharyngeal -- Pharyngeal- Puree -- Pharyngeal -- Pharyngeal- Mechanical Soft --  Pharyngeal -- Pharyngeal- Regular -- Pharyngeal -- Pharyngeal- Multi-consistency -- Pharyngeal -- Pharyngeal- Pill -- Pharyngeal -- Pharyngeal Comment --  CHL IP CERVICAL ESOPHAGEAL PHASE 10/22/2019 Cervical Esophageal Phase Impaired Pudding Teaspoon -- Pudding Cup -- Honey Teaspoon -- Honey Cup -- Nectar Teaspoon -- Nectar Cup -- Nectar Straw -- Thin Teaspoon -- Thin Cup -- Thin Straw -- Puree -- Mechanical Soft -- Regular -- Multi-consistency -- Pill -- Cervical Esophageal Comment Retention of barium in thoracic esophagus with backflow Chelsea Goodman, [place], [place] Office number [phone] Pager [place] 10/22/2019, 12:09 PM                LOS: 4 days   Time spent: More than 50% of that time was spent in counseling and/or coordination of care.  Antonieta Pert, MD [facility]  10/23/2019, 1:17 PM

## 2019-10-23 NOTE — Progress Notes (Signed)
Pt's BP was 76/42 manual. Brady around 50's.  Asymptomatic. Notified Cardiology.

## 2019-10-23 NOTE — TOC Transition Note (Signed)
Transition of Care [facility]) - CM/SW Discharge Note   Patient Details  Name: Chelsea Goodman MRN: [id number] Date of Birth: March 17, 1934  [facility] CM/SW Contact:  Leone Haven, RN Phone Number: 10/23/2019, 12:44 PM   Clinical Narrative:    Patient will be going home with daughter at [address] , [place] ,Patient has rolling walker and oxygen in the room. Per daughter states she has received information from [employer] regarding the hospital bed delivery.   Per her daughter her PCP will be [facility], Dr. Carlena Sax, she will have an apt next week NCM  Spoke with daughter and they will have the [facility] office set up the [facility] services when patient has follow up with them, because they will have to have a MD to sign off on the orders. NCM asked MD to request [facility] services in the dc summary once she has her apt with new PCP.   Final next level of care: Home/Self Care Barriers to Discharge: Continued Medical Work up   Patient Goals and CMS Choice Patient states their goals for this hospitalization and ongoing recovery are:: get better CMS Medicare.gov Compare Post Acute Care list provided to:: Patient Represenative (must comment) Choice offered to / list presented to : Adult Children  Discharge Placement                       Discharge Plan and Services                DME Arranged: Oxygen, Walker rolling, Hospital bed DME Agency: [employer] Date DME Agency Contacted: 10/22/19 Time DME Agency Contacted: 1000 Representative spoke with at DME Agency: Ian Malkin            Social Determinants of Health (SDOH) Interventions     Readmission Risk Interventions No flowsheet data found.

## 2019-10-24 ENCOUNTER — Inpatient Hospital Stay (HOSPITAL_COMMUNITY): Payer: Medicare Other

## 2019-10-24 LAB — HEPATIC FUNCTION PANEL
ALT: 20 U/L (ref 0–44)
AST: 22 U/L (ref 15–41)
Albumin: 3.4 g/dL — ABNORMAL LOW (ref 3.5–5.0)
Alkaline Phosphatase: 62 U/L (ref 38–126)
Bilirubin, Direct: 0.1 mg/dL (ref 0.0–0.2)
Indirect Bilirubin: 0.6 mg/dL (ref 0.3–0.9)
Total Bilirubin: 0.7 mg/dL (ref 0.3–1.2)
Total Protein: 5.7 g/dL — ABNORMAL LOW (ref 6.5–8.1)

## 2019-10-24 LAB — BASIC METABOLIC PANEL
Anion gap: 9 (ref 5–15)
BUN: 35 mg/dL — ABNORMAL HIGH (ref 8–23)
CO2: 27 mmol/L (ref 22–32)
Calcium: 8.7 mg/dL — ABNORMAL LOW (ref 8.9–10.3)
Chloride: 103 mmol/L (ref 98–111)
Creatinine, Ser: 1.06 mg/dL — ABNORMAL HIGH (ref 0.44–1.00)
GFR calc Af Amer: 56 mL/min — ABNORMAL LOW (ref >60)
GFR calc non Af Amer: 48 mL/min — ABNORMAL LOW (ref >60)
Glucose, Bld: 99 mg/dL (ref 70–99)
Potassium: 3.5 mmol/L (ref 3.5–5.1)
Sodium: 139 mmol/L (ref 135–145)

## 2019-10-24 LAB — LIPASE, BLOOD: Lipase: 23 U/L (ref 11–51)

## 2019-10-24 LAB — BRAIN NATRIURETIC PEPTIDE: B Natriuretic Peptide: 309.3 pg/mL — ABNORMAL HIGH (ref 0.0–100.0)

## 2019-10-24 MED ORDER — PREDNISONE 20 MG PO TABS
20.0000 mg | ORAL_TABLET | Freq: Every day | ORAL | Status: DC
  Administered 2019-10-25: 20 mg via ORAL
  Filled 2019-10-24: qty 1

## 2019-10-24 MED ORDER — FAMOTIDINE IN NACL 20-0.9 MG/50ML-% IV SOLN
20.0000 mg | INTRAVENOUS | Status: DC
  Administered 2019-10-24: 20 mg via INTRAVENOUS
  Filled 2019-10-24: qty 50

## 2019-10-24 NOTE — Progress Notes (Signed)
Physical Therapy Treatment Patient Details Name: Chelsea Goodman MRN: [id number] DOB: 24-Jun-1933 Today's Date: 10/24/2019    History of Present Illness Chelsea Goodman is a 86 y.o. female with medical history significant for CABG, hypertension.  Brought to the ED via EMS for reports of difficulty breathing, on EMS arrival patient was walking around the house confused, O2 sats 84% on room air.  Was placed on nasal cannula and O2 sats improved to 100%.At the time of my evaluation patient is awake alert and oriented.  She does not remember the episode of confusion. She has been having difficulty breathing ongoing over the past week.    PT Comments    Pt reports feeling much better this afternoon as her nausea has dissipated. Pt son and daughter in room and daughter provided housing set up information. Pt is limited in safe mobility by supplemental O2 need in addition to decreased strength and endurance. Pt is min A for bed mobility, min guard for transfers and hands on min guard for ambulation of 120 feet with RW. Pt requires one brief standing rest break. Pt and family are hopeful for d/c to daughters house tomorrow.     Follow Up Recommendations  Home health PT;Supervision for mobility/OOB     Equipment Recommendations  Rolling walker with 5" wheels       Precautions / Restrictions Precautions Precautions: Fall Precaution Comments: mod fall, watch SpO2 Restrictions Weight Bearing Restrictions: No    Mobility  Bed Mobility Overal bed mobility: Needs Assistance Bed Mobility: Supine to Sit;Sit to Supine     Supine to sit: Min assist     General bed mobility comments: min A with hand over hand and multimodal cuing as well as minA for pt to pull up against therapist to bring truk to upright  Transfers Overall transfer level: Needs assistance Equipment used: Rolling walker (2 wheeled) Transfers: Sit to/from Stand Sit to Stand: Min guard         General transfer comment: min guard  for safety, muldimodal cuing for safe hand placement to powe up and steady  Ambulation/Gait Ambulation/Gait assistance: Min guard   Assistive device: Rolling walker (2 wheeled) Gait Pattern/deviations: Step-through pattern;Decreased stride length Gait velocity: decr Gait velocity interpretation: 1.31 - 2.62 ft/sec, indicative of limited community ambulator General Gait Details: min guard for safety, multimodal cuing for proximity to RW          Balance Overall balance assessment: Needs assistance Sitting-balance support: Feet supported Sitting balance-Leahy Scale: Good     Standing balance support: Single extremity supported;Bilateral upper extremity supported;During functional activity Standing balance-Leahy Scale: Fair Standing balance comment: pt able to ambulate with RW without physical assist                            Cognition Arousal/Alertness: Awake/alert Behavior During Therapy: WFL for tasks assessed/performed Overall Cognitive Status: Within Functional Limits for tasks assessed                                 General Comments: pt benefits from multimodal cuing due to [facility]      Exercises General Exercises - Lower Extremity Long Arc Quad: (5 AROM, 10 against manual resistance for both knee ext and flexion)    General Comments General comments (skin integrity, edema, etc.): No complaints of dizziness or nausea with mobility,       Pertinent  Vitals/Pain Pain Assessment: No/denies pain           PT Goals (current goals can now be found in the care plan section) Acute Rehab PT Goals Patient Stated Goal: to get stronger PT Goal Formulation: With patient Time For Goal Achievement: 11/03/19 Potential to Achieve Goals: Good    Frequency    Min 3X/week      PT Plan Current plan remains appropriate       AM-PAC PT "6 Clicks" Mobility   Outcome Measure  Help needed turning from your back to your side while in a flat bed  without using bedrails?: A Little Help needed moving from lying on your back to sitting on the side of a flat bed without using bedrails?: A Little Help needed moving to and from a bed to a chair (including a wheelchair)?: A Little Help needed standing up from a chair using your arms (e.g., wheelchair or bedside chair)?: A Little Help needed to walk in hospital room?: A Little Help needed climbing 3-5 steps with a railing? : A Lot 6 Click Score: 17    End of Session Equipment Utilized During Treatment: Gait belt;Oxygen Activity Tolerance: Patient tolerated treatment well Patient left: with call bell/phone within reach;with family/visitor present;in chair Nurse Communication: Mobility status PT Visit Diagnosis: Unsteadiness on feet (R26.81);Other abnormalities of gait and mobility (R26.89);Muscle weakness (generalized) (M62.81);Difficulty in walking, not elsewhere classified (R26.2)     Time: 4034-7425 PT Time Calculation (min) (ACUTE ONLY): 19 min  Charges:  $Gait Training: 8-22 mins                     Taygan Connell B. Migdalia Dk PT, DPT Acute Rehabilitation Services Pager [phone] Office [phone]    [place] 10/24/2019, 3:10 PM

## 2019-10-24 NOTE — Plan of Care (Signed)
?  Problem: Education: ?Goal: Knowledge of General Education information will improve ?Description: Including pain rating scale, medication(s)/side effects and non-pharmacologic comfort measures ?Outcome: Progressing ?  ?Problem: Health Behavior/Discharge Planning: ?Goal: Ability to manage health-related needs will improve ?Outcome: Progressing ?  ?Problem: Clinical Measurements: ?Goal: Ability to maintain clinical measurements within normal limits will improve ?Outcome: Progressing ?  ?Problem: Clinical Measurements: ?Goal: Respiratory complications will improve ?Outcome: Progressing ?  ?Problem: Clinical Measurements: ?Goal: Cardiovascular complication will be avoided ?Outcome: Progressing ?  ?

## 2019-10-24 NOTE — Progress Notes (Signed)
Progress Note  Patient Name: Chelsea Goodman Date of Encounter: 10/24/2019  Primary Cardiologist: Olga Millers, MD Will need to establish in [place], [place]  Subjective   Threw up this AM with morning meds.  Overall not feeling as well.  Breathing stable.  No chest pain.   Inpatient Medications    Scheduled Meds: . aspirin EC  81 mg Oral Daily  . atorvastatin  40 mg Oral q1800  . carvedilol  3.125 mg Oral BID WC  . doxycycline  100 mg Oral Q12H  . enoxaparin (LOVENOX) injection  30 mg Subcutaneous Q24H  . levothyroxine  75 mcg Oral Q0600  . losartan  12.5 mg Oral Daily  . [START ON 10/25/2019] predniSONE  20 mg Oral Q breakfast   Continuous Infusions: . sodium chloride 250 mL (10/24/19 1054)  . famotidine (PEPCID) IV 20 mg (10/24/19 1055)   PRN Meds: sodium chloride, acetaminophen **OR** acetaminophen, ipratropium-albuterol, labetalol, ondansetron **OR** ondansetron (ZOFRAN) IV, polyethylene glycol   Vital Signs    Vitals:   10/24/19 0325 10/24/19 0329 10/24/19 0814 10/24/19 1107  BP:  99/60 (!) 121/57 113/62  Pulse:  70 67 62  Resp:  20    Temp:  (!) 97.5 F (36.4 C)    TempSrc:  Oral    SpO2:  91%  93%  Weight: 35.1 kg     Height:        Intake/Output Summary (Last 24 hours) at 10/24/2019 1107 Last data filed at 10/24/2019 0600 Gross per 24 hour  Intake 440 ml  Output 400 ml  Net 40 ml   Last 3 Weights 10/24/2019 10/23/2019 10/22/2019  Weight (lbs) 77 lb 6.4 oz 78 lb 78 lb 12.8 oz  Weight (kg) 35.108 kg 35.381 kg 35.743 kg      Telemetry    n/a Personally Reviewed  ECG    10/19/19: Sinus rhythm.  Rate 66 bpm.  LAFB.  LVH with secondary repolarization abnormalities.   - Personally Reviewed  Physical Exam   VS:  BP 113/62 (BP Location: Left Arm)   Pulse 62   Temp (!) 97.5 F (36.4 C) (Oral)   Resp 20   Ht 5' (1.524 m)   Wt 35.1 kg Comment: scale c  SpO2 93%   BMI 15.12 kg/m  , BMI Body mass index is 15.12 kg/m. GENERAL: Well.  Chronically  ill-appearing HEENT: Pupils equal round and reactive, fundi not visualized, oral mucosa unremarkable NECK:  No jugular venous distention, waveform within normal limits, carotid upstroke brisk and symmetric, no bruits LUNGS:  Diminished breath sounds HEART:  RRR.  PMI not displaced or sustained,S1 and S2 within normal limits, no S3, no S4, no clicks, no rubs, no murmurs ABD:  Flat, positive bowel sounds normal in frequency in pitch, no bruits, no rebound, no guarding, no midline pulsatile mass, no hepatomegaly, no splenomegaly EXT:  2 plus pulses throughout, trace edema, no cyanosis no clubbing SKIN:  No rashes no nodules NEURO:  Cranial nerves II through XII grossly intact, motor grossly intact throughout PSYCH:  Cognitively intact, oriented to person place and time   Labs    High Sensitivity Troponin:   Recent Labs  Lab 10/18/19 1513 10/18/19 1846 10/18/19 2111 10/19/19 0640 10/19/19 0827  TROPONINIHS 219* 331* 410* 272* 206*      Chemistry Recent Labs  Lab 10/18/19 1112 10/19/19 0612 10/22/19 4270 10/23/19 0459 10/24/19 0731 10/24/19 0909  NA 136   < > 138 139 139  --   K  4.1   < > 3.4* 3.6 3.5  --   CL 99   < > 100 102 103  --   CO2 26   < > 28 30 27   --   GLUCOSE 213*   < > 98 121* 99  --   BUN 14   < > 31* 32* 35*  --   CREATININE 0.86   < > 0.97 1.08* 1.06*  --   CALCIUM 8.3*   < > 8.8* 8.8* 8.7*  --   PROT 6.8  --   --   --   --  5.7*  ALBUMIN 4.2  --   --   --   --  3.4*  AST 36  --   --   --   --  22  ALT 28  --   --   --   --  20  ALKPHOS 85  --   --   --   --  62  BILITOT 0.8  --   --   --   --  0.7  GFRNONAA >60   < > 54* 47* 48*  --   GFRAA >60   < > >60 55* 56*  --   ANIONGAP 11   < > 10 7 9   --    < > = values in this interval not displayed.     Hematology Recent Labs  Lab 10/19/19 0612 10/20/19 0617 10/23/19 0459  WBC 10.0 9.1 9.7  RBC 4.09 4.04 4.35  HGB 13.5 13.4 14.4  HCT 40.6 40.5 43.7  MCV 99.3 100.2* 100.5*  MCH 33.0 33.2 33.1   MCHC 33.3 33.1 33.0  RDW 13.2 13.3 13.2  PLT 152 160 170    BNP Recent Labs  Lab 10/22/19 0613 10/23/19 0459 10/24/19 0731  BNP 593.3* 714.6* 309.3*     DDimer No results for input(s): DDIMER in the last 168 hours.   Radiology    DG Swallowing Func-Speech Pathology  Result Date: 10/22/2019 Objective Swallowing Evaluation: Type of Study: MBS-Modified Barium Swallow Study  Patient Details Name: Chelsea Goodman MRN: [id number] Date of Birth: 1934/04/05 Today's Date: 10/22/2019 Time: SLP Start Time (ACUTE ONLY): 43 -SLP Stop Time (ACUTE ONLY): 1058 SLP Time Calculation (min) (ACUTE ONLY): 18 min Past Medical History: Past Medical History: Diagnosis Date . CHF (congestive heart failure) ([place])  . COPD (chronic obstructive pulmonary disease) ([place])  . Hypertension  Past Surgical History: Past Surgical History: Procedure Laterality Date . CORONARY ARTERY BYPASS GRAFT   HPI: Chelsea Goodman is a 86 y.o. female with medical history significant for COPD, CABG, hypertension admitted for reports of difficulty breathing. per char patient reports recently she has been having some difficulty swallowing especially her pills. Chest x-ray shows chronic findings suggestive of COPD.  CTA chest shows small pleural effusions, basilar atelectasis. Small amount of aspirate material in bilateral proximal bronchi.  No data recorded Assessment / Plan / Recommendation CHL IP CLINICAL IMPRESSIONS 10/22/2019 Clinical Impression Pt was seen in radiology suite for modified barium swallow study. Trials were limited to thin liquids and puree solids. Pt requested that the study be terminated since she found the barium to be noxious and was fearful of needing to vomit after the initial trials. Pt's oropharyngeal swallow mechanism was within functional limits with inconsistent premature spillage to the pyriform sinuses. Esophageal screening revealed retention of barium in the milddle to lower thoracic esophagus with backflow. Instances of  penetration/aspiration were not demonstrated during the study  but the possibility of backflow reaching the cervical esophagus/pharynx and ultimately leading to aspiration is considered. Further skilled SLP services are not clinically indicated at this time. Assessment of the esophageal phase of the swallow may be beneficial but the pt's willingness to consume barium for an additional assessment is questioned.  SLP Visit Diagnosis Dysphagia, unspecified (R13.10) Attention and concentration deficit following -- Frontal lobe and executive function deficit following -- Impact on safety and function Mild aspiration risk   CHL IP TREATMENT RECOMMENDATION 10/22/2019 Treatment Recommendations No treatment recommended at this time   Prognosis 10/22/2019 Prognosis for Safe Diet Advancement Good Barriers to Reach Goals -- Barriers/Prognosis Comment -- CHL IP DIET RECOMMENDATION 10/22/2019 SLP Diet Recommendations Regular solids;Thin liquid Liquid Administration via Cup;Straw Medication Administration Whole meds with liquid Compensations Slow rate;Small sips/bites;Follow solids with liquid Postural Changes Seated upright at 90 degrees;Remain semi-upright after after feeds/meals (Comment)   CHL IP OTHER RECOMMENDATIONS 10/22/2019 Recommended Consults Consider esophageal assessment Oral Care Recommendations Oral care BID Other Recommendations --   CHL IP FOLLOW UP RECOMMENDATIONS 10/22/2019 Follow up Recommendations None   CHL IP FREQUENCY AND DURATION 10/19/2019 Speech Therapy Frequency (ACUTE ONLY) min 1 x/week Treatment Duration 1 week      CHL IP ORAL PHASE 10/22/2019 Oral Phase WFL Oral - Pudding Teaspoon -- Oral - Pudding Cup -- Oral - Honey Teaspoon -- Oral - Honey Cup -- Oral - Nectar Teaspoon -- Oral - Nectar Cup -- Oral - Nectar Straw -- Oral - Thin Teaspoon -- Oral - Thin Cup -- Oral - Thin Straw -- Oral - Puree -- Oral - Mech Soft -- Oral - Regular -- Oral - Multi-Consistency -- Oral - Pill -- Oral Phase - Comment --  CHL IP  PHARYNGEAL PHASE 10/22/2019 Pharyngeal Phase WFL Pharyngeal- Pudding Teaspoon -- Pharyngeal -- Pharyngeal- Pudding Cup -- Pharyngeal -- Pharyngeal- Honey Teaspoon -- Pharyngeal -- Pharyngeal- Honey Cup -- Pharyngeal -- Pharyngeal- Nectar Teaspoon -- Pharyngeal -- Pharyngeal- Nectar Cup -- Pharyngeal -- Pharyngeal- Nectar Straw -- Pharyngeal -- Pharyngeal- Thin Teaspoon -- Pharyngeal -- Pharyngeal- Thin Cup -- Pharyngeal -- Pharyngeal- Thin Straw -- Pharyngeal -- Pharyngeal- Puree -- Pharyngeal -- Pharyngeal- Mechanical Soft -- Pharyngeal -- Pharyngeal- Regular -- Pharyngeal -- Pharyngeal- Multi-consistency -- Pharyngeal -- Pharyngeal- Pill -- Pharyngeal -- Pharyngeal Comment --  CHL IP CERVICAL ESOPHAGEAL PHASE 10/22/2019 Cervical Esophageal Phase Impaired Pudding Teaspoon -- Pudding Cup -- Honey Teaspoon -- Honey Cup -- Nectar Teaspoon -- Nectar Cup -- Nectar Straw -- Thin Teaspoon -- Thin Cup -- Thin Straw -- Puree -- Mechanical Soft -- Regular -- Multi-consistency -- Pill -- Cervical Esophageal Comment Retention of barium in thoracic esophagus with backflow Shanika I. Vear Clock, MS, CCC-SLP Acute Rehabilitation Services Office number [phone] Pager [phone] [name] 10/22/2019, 12:09 PM               Cardiac Studies   Echo 10/19/19: IMPRESSIONS   1. Global hypokinesis with akinesis of the inferolateral wall and apex;  overall severely reduced LV systolic function; grade 1 diastolic  dysfunction; moderate LVH; trace AI; mild MR; mild LAE.  2. Left ventricular ejection fraction, by estimation, is 25 to 30%. The  left ventricle has severely decreased function. The left ventricle  demonstrates regional wall motion abnormalities (see scoring  diagram/findings for description). There is moderate  left ventricular hypertrophy. Left ventricular diastolic parameters are  consistent with Grade I diastolic dysfunction (impaired relaxation).  3. Right ventricular systolic function is normal. The  right ventricular  size  is normal. There is normal pulmonary artery systolic pressure.  4. Left atrial size was mildly dilated.  5. The mitral valve is normal in structure. Mild mitral valve  regurgitation. No evidence of mitral stenosis.  6. The aortic valve is tricuspid. Aortic valve regurgitation is trivial.  Mild to moderate aortic valve sclerosis/calcification is present, without  any evidence of aortic stenosis.  7. The inferior vena cava is normal in size with greater than 50%  respiratory variability, suggesting right atrial pressure of 3 mmHg.   Lexiscan Myoview 10/21/19: IMPRESSION: 1. Suspected large territory infarct involving the left ventricular apex. 2. No definitive scintigraphic evidence of pharmacologically induced ischemia. 3. Moderate left ventricular dilatation with global hypokinesia and relative akinesia involving the septum. 4. Ejection fraction - 27%.  Patient Profile     86 y.o. female with CAD s/p CABG (2001), hypertension, hyperlipidemia, and COPD admitted with acute systolic and diastolic heart failure  Assessment & Plan    # Acute systolic and diastolic heart failure: LVEF this admission 25 to 30% with global hypokinesis and akinesis of the inferolateral wall and apex.  Right ventricular function was normal.  It is unclear what her baseline systolic function was, but she does not recall ever being told she had heart failure.  IVC was not dilated on echo.  BNP was 1792 and now down to 309.  She did not tolerate Entresto due to hypotension.  Losartan 12.5 mg was started this AM.  We are holding her home benazepril and starting losartan today.  Plan to switch to [facility] after her ACE inhibitor has been held for 48 hours.  Lexiscan Myoview this admission revealed LVEF 20% with apical infarct and no ischemia.  We discussed the importance of weighing daily and limiting sodium to 2 g and fluid to 2 L.  # AKI: Resolved.  Lasix 20 mg daily as needed for weight  gain of 2 pounds in a day or 5 pounds in 1 week.  # Essential hypertension:  Carvedilol and losartan as above.   # CAD s/p CABG: # Hyperlipidemia:  She had CABG in 2001 and then was lost to follow-up.  Records are not available.  High-sensitivity troponin mildly elevated and peaked at 331.  Lexiscan Myoview was negative for ischemia as above.  Her home simvastatin was switched to atorvastatin this admission.  She will need repeat lipids and a CMP in 3 months.  Metoprolol was switched to carvedilol.  Her home aspirin actually reduced to 81 mg.  # COPD:  # Hypoxia:  Hypoxia multifactorial with COPD, aspiration, smoke inalation, and acute on chronic systolic diastolic heart failure.  She has a new cough and had some vomiting today.  We will check a chest x-ray to make sure there is no signs of pneumonia.  # Frailty: PT recommends 24 hour care and home PT. she is going to live with family.  # Dispo: Safe to be discharged home from a cardiology standpoint.  She has follow-up with [facility]ology in [place].  [facility] will sign off.   Medication Recommendations:  Consider Entresto as an outpatient if blood pressure tolerates Other recommendations (labs, testing, etc): Lipids and a CMP in 3 months Follow up as an outpatient: Scheduled in [place]   For questions or updates, please contact [facility] Please consult [email or web address] for contact info under        Signed, Chilton Si, MD  10/24/2019, 11:07 AM

## 2019-10-24 NOTE — Progress Notes (Signed)
Pt threw up right after taking morning medications. Complaining of feeling weak Notified MD.

## 2019-10-24 NOTE — Plan of Care (Signed)
  Problem: Education: Goal: Knowledge of General Education information will improve Description: Including pain rating scale, medication(s)/side effects and non-pharmacologic comfort measures Outcome: Progressing   Problem: Health Behavior/Discharge Planning: Goal: Ability to manage health-related needs will improve Outcome: Progressing   Problem: Clinical Measurements: Goal: Ability to maintain clinical measurements within normal limits will improve Outcome: Progressing   Problem: Cardiac: Goal: Ability to achieve and maintain adequate cardiopulmonary perfusion will improve Outcome: Progressing

## 2019-10-24 NOTE — Progress Notes (Signed)
PROGRESS NOTE    Chelsea Goodman  YWV:[id number] DOB: July 30, 1933 DOA: 10/18/2019 PCP: Patient, No Pcp Per   Brief Narrative: As per HPI:  86 y.o. female with medical history significant for CABG, hypertension.  Brought to the ED via EMS for reports of difficulty breathing, on EMS arrival patient was walking around the house confused, O2 sats 84% on room air.  Was placed on nasal cannula and O2 sats improved to 100%. At the time of my evaluation patient is awake alert and oriented.  She does not remember the episode of confusion. She has been having difficulty breathing ongoing over the past week. Patient has a remote history of smoking, over 40 years ago.  But she over the past 20 years she has been using a wood stove at home.  Over the past month the [employer] has being malfunctioning, patient's family has noticed the house smelling more of smoke, but this significantly worsened over the past 2 weeks, with smoke lingering in the air.  Patient's daughter had rattling in patient's chest over the past 2 weeks.  Patient is unaware of rattling or wheezing.  She has a chronic unchanged cough. No chest pain, no leg swelling, no weight gain, no bloating. Patient reports recently she has been having some difficulty swallowing especially her pills.  ED Course: Blood pressure elevated initially 200 systolic, improved to 130s with resuming home antihypertensives, temperature 97.2, tachypneic.  WBC 14.9.  BNP markedly elevated at 1792.  Troponin 43 >> 118.  UA moderate hemoglobin not suggestive of UTI.  pH 7.38, PCO2 45, PO2 78. Chest x-ray shows chronic findings suggestive of COPD.  CTA chest shows small pleural effusions, basilar atelectasis.  Small amount of aspirate material in bilateral proximal bronchi. EDP talked to cardiology recommended admission to [facility], so that cardiology can see in consult.  IV Lasix 40 mg x 1 given.  Hospitalist admited for acute respiratory failure  Patient was admitted and  treated for acute systolic and diastolic heart failure, AKI and acute hypoxic respiratory failure due to COPD CHF and possible aspiration pneumonia. Patient was seen by cardiology, underwent nuclear stress test that showed prior infarct without significant ischemia.  Cardiology plan for medical management Entresto 24/26 twice daily started continued carvedilol but holding Lasix due to inconsistent oral intake/tenuous renal dysfunction.Underwent modified barium swallow but did not complete the esophageal portion as patient was hesitant to swallow the barium due to fear of nausea/vomiting and SLP ADVISED advised regular diet. 5/6-episode of hypotension Coreg was decreased and Entresto discontinued. 5/7-blood pressure improved but patient feeling more awake and vomited after taking medication.  Subjective Feeling weak this morning.  Vomited all her medication after taking them.  Blood pressure has been improved.    Assessment plan Acute Hypoxic respiratory failure:Due to CHF, COPD, possible aspiration pneumonia: Patient will need oxygen on ambulation.  Overall clinically improving-feeling weak and tired this morning.  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  Seen by cardiology -stopped Entresto due to hypotension 5/6-Coreg dose cut down.  Holding home Benzapril and starting losartan this morning.  Planning for outpatient follow-up to switch to [facility] in bp tolerates.  Lasix to be used on as-needed basis.  Nausea vomiting this morning.  Weak and vomited patient  her medication.  P does not feel stable for discharge today.  Check LFTs and lipase.  Patient has been on a steroid will add IV Pepcid for potential gastritis.  Start  weaning down steroid.  Debility: PT OT speech eval completed and will need home health and will be going to daughter's house.  Hospital bed and walker will be arranged  COPD with acute exacerbation: Improving, able to come  off at rest, will need oxygen on ambulation and case manager will arrange.  Continue doxycycline, bronchodilators and taper off prednisone-continue 20 mg today.  Hypertension:Blood pressure with hypotension stopped Entresto and meds adjusted.  Blood pressure improved today.  Monitor.    AKI ruled out  Prediabetes- monitor  glucose Lab Results  Component Value Date   HGBA1C 6.3 (H) 10/19/2019   Hypothyroidism:cont home levothyroxine   DVT prophylaxis:lovenox Code Status: Partial  Family Communication: plan of care discussed with patient and family at bedside.  Family does not feel comfortable taking her to [place] today.    Status is: Inpatient  Remains inpatient appropriate because:Hemodynamically unstable, will need monitoring of blood pressure after adjusting her medication-decrease in Coreg dose, stopping Entresto and for further trial of medication tomorrow morning.  Dispo:The patient is from: Home             Anticipated d/c is to: Home to daughter's house with home health             Anticipated d/c date is: 1 day             Patient currently is not medically stable to d/c.  Nutrition: Diet Order            Diet Heart Room service appropriate? Yes; Fluid consistency: Thin  Diet effective now             Body mass index is 15.12 kg/m. Consultants:see note  Procedures:see note Microbiology:see note  Medications: Scheduled Meds: . aspirin EC  81 mg Oral Daily  . atorvastatin  40 mg Oral q1800  . carvedilol  3.125 mg Oral BID WC  . doxycycline  100 mg Oral Q12H  . enoxaparin (LOVENOX) injection  30 mg Subcutaneous Q24H  . levothyroxine  75 mcg Oral Q0600  . losartan  12.5 mg Oral Daily  . [START ON 10/25/2019] predniSONE  20 mg Oral Q breakfast   Continuous Infusions: . sodium chloride 250 mL (10/24/19 1054)  . famotidine (PEPCID) IV 20 mg (10/24/19 1055)    Antimicrobials: Anti-infectives (From admission, onward)   Start     Dose/Rate Route Frequency  Ordered Stop   10/19/19 1215  doxycycline (VIBRA-TABS) tablet 100 mg     100 mg Oral Every 12 hours 10/19/19 1117     10/18/19 1730  Ampicillin-Sulbactam (UNASYN) 3 g in sodium chloride 0.9 % 100 mL IVPB  Status:  Discontinued     3 g 200 mL/hr over 30 Minutes Intravenous Every 8 hours 10/18/19 1713 10/19/19 1117       Objective: Vitals: Today's Vitals   10/24/19 0329 10/24/19 0800 10/24/19 0814 10/24/19 1107  BP: 99/60  (!) 121/57 113/62  Pulse: 70  67 62  Resp: 20     Temp: (!) 97.5 F (36.4 C)     TempSrc: Oral     SpO2: 91%   93%  Weight:      Height:      PainSc:  0-No pain      Intake/Output Summary (Last 24 hours) at 10/24/2019 1145 Last data filed at 10/24/2019 0600 Gross per 24 hour  Intake 440 ml  Output 400 ml  Net 40 ml   Filed Weights   10/22/19 0200 10/23/19 0543 10/24/19 0325  Weight: 35.7 kg 35.4 kg 35.1 kg   Weight change: -0.272 kg   Intake/Output from previous day: 05/06 0701 - 05/07 0700 In: 676 [P.O.:676] Out: 400 [Urine:400] Intake/Output this shift: No intake/output data recorded.  Examination:  General exam: AAOx3, weak, frail.  Elderly. HEENT:Oral mucosa moist, Ear/Nose WNL grossly,dentition normal. Respiratory system: bilaterally without wheezing or crackles, no use of accessory muscle, non tender. Cardiovascular system: S1 & S2 +, regular, No JVD. Gastrointestinal system: Abdomen soft, NT,ND, BS+. Nervous System:Alert, awake, moving extremities and grossly nonfocal Extremities: No edema, distal peripheral pulses palpable.  Skin: No rashes,no icterus. MSK: Normal muscle bulk,tone, power  Data Reviewed: I have personally reviewed following labs and imaging studies CBC: Recent Labs  Lab 10/18/19 1112 10/19/19 0612 10/20/19 0617 10/23/19 0459  WBC 14.9* 10.0 9.1 9.7  NEUTROABS 12.9*  --   --   --   HGB 14.0 13.5 13.4 14.4  HCT 44.5 40.6 40.5 43.7  MCV 107.0* 99.3 100.2* 100.5*  PLT 165 152 160 932   Basic Metabolic  Panel: Recent Labs  Lab 10/20/19 0617 10/20/19 0617 10/21/19 0505 10/21/19 0711 10/22/19 0613 10/23/19 0459 10/24/19 0731  NA 136   < > 137 135 138 139 139  K 3.6   < > 3.5 3.7 3.4* 3.6 3.5  CL 97*   < > 98 99 100 102 103  CO2 28   < > 29 28 28 30 27   GLUCOSE 110*   < > 104* 107* 98 121* 99  BUN 27*   < > 33* 34* 31* 32* 35*  CREATININE 1.24*   < > 1.01* 0.92 0.97 1.08* 1.06*  CALCIUM 9.0   < > 9.0 8.9 8.8* 8.8* 8.7*  MG 1.9  --   --   --   --   --   --    < > = values in this interval not displayed.   GFR: Estimated Creatinine Clearance: 21.9 mL/min (A) (by C-G formula based on SCr of 1.06 mg/dL (H)). Liver Function Tests: Recent Labs  Lab 10/18/19 1112 10/24/19 0909  AST 36 22  ALT 28 20  ALKPHOS 85 62  BILITOT 0.8 0.7  PROT 6.8 5.7*  ALBUMIN 4.2 3.4*   Recent Labs  Lab 10/24/19 0909  LIPASE 23   No results for input(s): AMMONIA in the last 168 hours. Coagulation Profile: No results for input(s): INR, PROTIME in the last 168 hours. Cardiac Enzymes: No results for input(s): CKTOTAL, CKMB, CKMBINDEX, TROPONINI in the last 168 hours. BNP (last 3 results) No results for input(s): PROBNP in the last 8760 hours. HbA1C: No results for input(s): HGBA1C in the last 72 hours. CBG: No results for input(s): GLUCAP in the last 168 hours. Lipid Profile: No results for input(s): CHOL, HDL, LDLCALC, TRIG, CHOLHDL, LDLDIRECT in the last 72 hours. Thyroid Function Tests: No results for input(s): TSH, T4TOTAL, FREET4, T3FREE, THYROIDAB in the last 72 hours. Anemia Panel: No results for input(s): VITAMINB12, FOLATE, FERRITIN, TIBC, IRON, RETICCTPCT in the last 72 hours. Sepsis Labs: Recent Labs  Lab 10/19/19 0640  PROCALCITON 0.37    Recent Results (from the past 240 hour(s))  Respiratory Panel by RT PCR (Flu A&B, Covid) - Nasopharyngeal Swab     Status: None   Collection Time: 10/18/19 12:04 PM   Specimen: Nasopharyngeal Swab  Result Value Ref Range Status   SARS  Coronavirus 2 by RT PCR NEGATIVE NEGATIVE Final    Comment: (NOTE) SARS-CoV-2 target nucleic acids are NOT DETECTED.  The SARS-CoV-2 RNA is generally detectable in upper respiratoy specimens during the acute phase of infection. The lowest concentration of SARS-CoV-2 viral copies this assay can detect is 131 copies/mL. A negative result does not preclude SARS-Cov-2 infection and should not be used as the sole basis for treatment or other patient management decisions. A negative result may occur with  improper specimen collection/handling, submission of specimen other than nasopharyngeal swab, presence of viral mutation(s) within the areas targeted by this assay, and inadequate number of viral copies (<131 copies/mL). A negative result must be combined with clinical observations, patient history, and epidemiological information. The expected result is Negative. Fact Sheet for Patients:  https://www.moore.com/ Fact Sheet for Healthcare Providers:  https://www.young.biz/ This test is not yet ap proved or cleared by the [place] FDA and  has been authorized for detection and/or diagnosis of SARS-CoV-2 by FDA under an Emergency Use Authorization (EUA). This EUA will remain  in effect (meaning this test can be used) for the duration of the COVID-19 declaration under Section 564(b)(1) of the Act, 21 U.S.C. section 360bbb-3(b)(1), unless the authorization is terminated or revoked sooner.    Influenza A by PCR NEGATIVE NEGATIVE Final   Influenza B by PCR NEGATIVE NEGATIVE Final    Comment: (NOTE) The Xpert Xpress SARS-CoV-2/FLU/RSV assay is intended as an aid in  the diagnosis of influenza from Nasopharyngeal swab specimens and  should not be used as a sole basis for treatment. Nasal washings and  aspirates are unacceptable for Xpert Xpress SARS-CoV-2/FLU/RSV  testing. Fact Sheet for Patients: https://www.moore.com/ Fact Sheet  for Healthcare Providers: https://www.young.biz/ This test is not yet approved or cleared by the [place] FDA and  has been authorized for detection and/or diagnosis of SARS-CoV-2 by  FDA under an Emergency Use Authorization (EUA). This EUA will remain  in effect (meaning this test can be used) for the duration of the  Covid-19 declaration under Section 564(b)(1) of the Act, 21  U.S.C. section 360bbb-3(b)(1), unless the authorization is  terminated or revoked. Performed at [facility], [address] [place], [place] [address]       Radiology Studies: No results found.   LOS: 5 days   Time spent: More than 50% of that time was spent in counseling and/or coordination of care.  Lanae Boast, MD [facility]  10/24/2019, 11:45 AM

## 2019-10-25 LAB — BASIC METABOLIC PANEL
Anion gap: 10 (ref 5–15)
BUN: 36 mg/dL — ABNORMAL HIGH (ref 8–23)
CO2: 28 mmol/L (ref 22–32)
Calcium: 9.1 mg/dL (ref 8.9–10.3)
Chloride: 101 mmol/L (ref 98–111)
Creatinine, Ser: 1.22 mg/dL — ABNORMAL HIGH (ref 0.44–1.00)
GFR calc Af Amer: 47 mL/min — ABNORMAL LOW (ref >60)
GFR calc non Af Amer: 41 mL/min — ABNORMAL LOW (ref >60)
Glucose, Bld: 175 mg/dL — ABNORMAL HIGH (ref 70–99)
Potassium: 3.7 mmol/L (ref 3.5–5.1)
Sodium: 139 mmol/L (ref 135–145)

## 2019-10-25 LAB — BRAIN NATRIURETIC PEPTIDE: B Natriuretic Peptide: 452.5 pg/mL — ABNORMAL HIGH (ref 0.0–100.0)

## 2019-10-25 MED ORDER — FAMOTIDINE 40 MG PO TABS
40.0000 mg | ORAL_TABLET | Freq: Every evening | ORAL | 11 refills | Status: AC
Start: 2019-10-25 — End: 2020-10-24

## 2019-10-25 MED ORDER — PREDNISONE 20 MG PO TABS: 20.0000 mg | ORAL_TABLET | Freq: Every day | ORAL | 0 refills | Status: AC

## 2019-10-25 MED ORDER — ASPIRIN 81 MG PO TBEC: 81.0000 mg | DELAYED_RELEASE_TABLET | Freq: Every day | ORAL | 1 refills | Status: AC

## 2019-10-25 MED ORDER — ALBUTEROL SULFATE HFA 108 (90 BASE) MCG/ACT IN AERS: 2.0000 | INHALATION_SPRAY | Freq: Four times a day (QID) | RESPIRATORY_TRACT | 0 refills | Status: AC | PRN

## 2019-10-25 MED ORDER — LOSARTAN POTASSIUM 25 MG PO TABS: 12.5000 mg | ORAL_TABLET | Freq: Every day | ORAL | 0 refills | Status: AC

## 2019-10-25 MED ORDER — LEVOTHYROXINE SODIUM 75 MCG PO TABS: 75.0000 ug | ORAL_TABLET | Freq: Every day | ORAL | 0 refills | Status: AC

## 2019-10-25 MED ORDER — ATORVASTATIN CALCIUM 40 MG PO TABS: 40.0000 mg | ORAL_TABLET | Freq: Every day | ORAL | 0 refills | Status: AC

## 2019-10-25 MED ORDER — CARVEDILOL 3.125 MG PO TABS: 3.1250 mg | ORAL_TABLET | Freq: Two times a day (BID) | ORAL | 0 refills | Status: AC

## 2019-10-25 NOTE — Discharge Summary (Signed)
Physician Discharge Summary  KOBY HARTFIELD YTK:[id number] DOB: December 06, 1933 DOA: 10/18/2019  PCP: Patient, No Pcp Per  Admit date: 10/18/2019 Discharge date: 10/25/2019  Admitted From: home Disposition:  Home  Recommendations for Outpatient Follow-up:  1. Follow up with PCP in 1-2 weeks 2. Please obtain BMP/CBC in one week 3. Please follow up on the following pending results:  Home Health:Yes but will be set up by her PCP Equipment/Devices: yes  Discharge Condition: Stable Code Status: partial Diet recommendation:  Diet Order            Diet - low sodium heart healthy        Diet Heart Room service appropriate? Yes; Fluid consistency: Thin  Diet effective now             Brief/Interim Summary: 86 y.o.femalewith medical history significant forCABG, hypertension. Brought to the ED via EMS for reports of difficulty breathing, on EMS arrival patient was walking around the house confused,O2 sats 84% on room air. Was placed on nasal cannula and O2 sats improved to 100%. At the time of my evaluation patient is awake alert and oriented. She does not remember the episode of confusion. She has been having difficultybreathing ongoing over the past week. Patient has a remote history of smoking, over 40 years ago. But she over the past 20 years she has been using a wood stoveat home. Over the past month thewoodstove has beingmalfunctioning, patient's family has noticed the house smelling more of smoke, but this significantly worsened over the past 2 weeks,with smoke lingering in the air. Patient's daughter had rattling in patient's chest over the past 2 weeks. Patient is unaware of rattling or wheezing. She has a chronic unchanged cough. No chest pain, no leg swelling, no weight gain, no bloating. Patient reports recently she hasbeenhaving somedifficulty swallowing especially her pills.  ED Course:Blood pressure elevated initially 200 systolic, improved to 130s withresuming  home antihypertensives, temperature 97.2, tachypneic.WBC 14.9. BNP markedly elevated at 1792. Troponin 43 >> 118.UA moderate hemoglobin not suggestive of UTI. pH 7.38, PCO2 45, PO2 78. Chest x-ray shows chronic findings suggestive of COPD. CTA chest shows small pleural effusions, basilar atelectasis. Small amount of aspirate material in bilateral proximal bronchi. EDPtalked to cardiology recommended admission to [facility], so that cardiology can see in consult. IV Lasix 40 mg x 1 given. Hospitalist admited for acute respiratory failure  Patient was admitted and treated for acute systolic and diastolic heart failure, AKI and acute hypoxic respiratory failure due to COPD CHF and possible aspiration pneumonia. Patient was seen by cardiology, underwent nuclear stress test that showed prior infarct without significant ischemia.  Cardiology plan for medical management Entresto 24/26 twice daily started continued carvedilol but holding Lasix due to inconsistent oral intake/tenuous renal dysfunction.Underwent modified barium swallow but did not complete the esophageal portion as patient was hesitant to swallow the barium due to fear of nausea/vomiting and SLP ADVISED advised regular diet. 5/6-episode of hypotension Coreg was decreased and Entresto discontinued. 5/7-blood pressure improved but patient feeling more awake and vomited after taking medication.  Patient was monitored additional night as family were uncomfortable taking her home. This morning overall stable, patient's family at the bedside and taking her to daughter's house.  Discharge Diagnoses:   Acute Hypoxic respiratory failure:Due to CHF, COPD, possible aspiration pneumonia: Patient will need oxygen on ambulation.  Is medically stable.  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  Seen by cardiology -stopped  Entresto due to hypotension 5/6-Coreg dose cut down.  Holding home  Benzapril and starting losartan this morning.  Planning for outpatient follow-up to switch to [facility] in bp tolerates.Lasix to be used on as-needed basis (Lasix 20 mg daily as needed for weight gain of 2 pounds in a day or 5 pounds in 1 week)-she will see PCP and Cardiology as patient soon.  Nausea vomiting this morning.  resolved. Transient.  Could be gastritis added Pepcid given that patient is on prednisone and doxycycline.  Debility: PT OT speech eval completed and will need home health and will be going to daughter's house.  Hospital bed and walker will be arranged  COPD with acute exacerbation: Improving, able to come off o2 at rest, will need oxygen on ambulation and case manager will arrange.  stop doxycycline, cont inhaler proair and and taper off prednisone-20 mg for few days  Hypertension:Blood pressure with hypotension stopped Entresto and meds adjusted.  Blood pressure improved  Monitor.    AKI ruled out  Prediabetes- monitor  glucose Plan of care discussed with patient and family members at the bedside verbalized understanding and is being discharged home to daughter's house. Discharge instruction was provided.  Consults:  Cardiology  Subjective: Sting comfortably this morning alert awake, high generalized weakness. Daughter/family member at the bedside.Feels ready for discharge today.  Discharge Exam: Vitals:   10/24/19 2043 10/25/19 0541  BP: (!) 97/59 135/72  Pulse: (!) 54 (!) 59  Resp: 18 18  Temp: 98 F (36.7 C) 98.1 F (36.7 C)  SpO2: 92% 93%   General: Pt is alert, awake, not in acute distress Cardiovascular: RRR, S1/S2 +, no rubs, no gallops Respiratory: CTA bilaterally, no wheezing, no rhonchi Abdominal: Soft, NT, ND, bowel sounds + Extremities: no edema, no cyanosis  Discharge Instructions  Discharge Instructions    Diet - low sodium heart healthy   Complete by: As directed    Discharge instructions   Complete by: As directed    You need  follow-up blood work CMP in 2 to 3 weeks  Please call call MD or return to ER for similar or worsening recurring problem that brought you to hospital or if any fever,nausea/vomiting,abdominal pain, uncontrolled pain, chest pain,  shortness of breath or any other alarming symptoms.  Please follow-up your doctor as instructed in a week time and call the office for appointment.  Please avoid alcohol, smoking, or any other illicit substance and maintain healthy habits including taking your regular medications as prescribed.  You were cared for by a hospitalist during your hospital stay. If you have any questions about your discharge medications or the care you received while you were in the hospital after you are discharged, you can call the unit and ask to speak with the hospitalist on call if the hospitalist that took care of you is not available.  Once you are discharged, your primary care physician will handle any further medical issues. Please note that NO REFILLS for any discharge medications will be authorized once you are discharged, as it is imperative that you return to your primary care physician (or establish a relationship with a primary care physician if you do not have one) for your aftercare needs so that they can reassess your need for medications and monitor your lab values   Increase activity slowly   Complete by: As directed      Allergies as of 10/25/2019      Reactions   Penicillins    Per daughter -  When patient was little, penicillin was given and she "almost died"      Medication List    STOP taking these medications   aspirin 325 MG tablet Replaced by: aspirin 81 MG EC tablet   benazepril 20 MG tablet Commonly known as: LOTENSIN   metoprolol tartrate 25 MG tablet Commonly known as: LOPRESSOR   simvastatin 20 MG tablet Commonly known as: ZOCOR     TAKE these medications   albuterol 108 (90 Base) MCG/ACT inhaler Commonly known as: VENTOLIN HFA Inhale 2 puffs  into the lungs every 6 (six) hours as needed for wheezing or shortness of breath. For 30 days supply.   aspirin 81 MG EC tablet Take 1 tablet (81 mg total) by mouth daily. Replaces: aspirin 325 MG tablet   atorvastatin 40 MG tablet Commonly known as: LIPITOR Take 1 tablet (40 mg total) by mouth daily at 6 PM.   carvedilol 3.125 MG tablet Commonly known as: COREG Take 1 tablet (3.125 mg total) by mouth 2 (two) times daily with a meal.   famotidine 40 MG tablet Commonly known as: PEPCID Take 1 tablet (40 mg total) by mouth every evening.   levothyroxine 75 MCG tablet Commonly known as: SYNTHROID Take 1 tablet (75 mcg total) by mouth daily.   losartan 25 MG tablet Commonly known as: COZAAR Take 0.5 tablets (12.5 mg total) by mouth daily.   predniSONE 20 MG tablet Commonly known as: DELTASONE Take 1 tablet (20 mg total) by mouth daily with breakfast for 4 days.            Durable Medical Equipment  (From admission, onward)         Start     Ordered   10/25/19 0904  DME Oxygen  Once    Question Answer Comment  Length of Need Lifetime   Mode or (Route) Nasal cannula   Liters per Minute 2   Oxygen delivery system Gas      10/25/19 0906   10/22/19 1604  DME Oxygen  (Discharge Planning)  Once    Question Answer Comment  Length of Need Lifetime   Mode or (Route) Nasal cannula   Liters per Minute 2   Frequency Continuous (stationary and portable oxygen unit needed)   Oxygen conserving device Yes   Oxygen delivery system Gas      10/22/19 1604   10/22/19 1558  For home use only DME Hospital bed  Once    Question Answer Comment  Length of Need Lifetime   Patient has (list medical condition): CHF   The above medical condition requires: Patient requires the ability to reposition frequently   Head must be elevated greater than: 30 degrees   Bed type Semi-electric   Support Surface: Gel Overlay      10/22/19 1557   10/22/19 1553  For home use only DME Walker  rolling  Once    Question Answer Comment  Walker: With 5 Inch Wheels   Patient needs a walker to treat with the following condition Weakness      10/22/19 1553         Follow-up Information    Gastroenterology, Deboraha Sprang. Call in 2 week(s).   Why: FOR GI eval as needed. Contact information: [address] [place] [address] [phone]        Meredith Staggers, MD Follow up in 3 day(s).   Specialty: Internal Medicine Why: please arrange for urget referral. Contact information: [address] [place]  [address] [phone]        [facility] Follow up.   Why: walker, oxygen delivered to patient room the hospital bed will be delivered to the home Contact information: [address] [place] [address] [phone]        Clydene Laming, MD Follow up in 1 week(s).   Specialty: Family Medicine Contact information: [address] [place] [address] [phone]          Allergies  Allergen Reactions  . Penicillins     Per daughter - When patient was little, penicillin was given and she "almost died"    The results of significant diagnostics from this hospitalization (including imaging, microbiology, ancillary and laboratory) are listed below for reference.    Microbiology: Recent Results (from the past 240 hour(s))  Respiratory Panel by RT PCR (Flu A&B, Covid) - Nasopharyngeal Swab     Status: None   Collection Time: 10/18/19 12:04 PM   Specimen: Nasopharyngeal Swab  Result Value Ref Range Status   SARS Coronavirus 2 by RT PCR NEGATIVE NEGATIVE Final    Comment: (NOTE) SARS-CoV-2 target nucleic acids are NOT DETECTED. The SARS-CoV-2 RNA is generally detectable in upper respiratoy specimens during the acute phase of infection. The lowest concentration of SARS-CoV-2 viral copies this assay can detect is 131 copies/mL. A negative result does not preclude SARS-Cov-2 infection and should not be used as the sole basis  for treatment or other patient management decisions. A negative result may occur with  improper specimen collection/handling, submission of specimen other than nasopharyngeal swab, presence of viral mutation(s) within the areas targeted by this assay, and inadequate number of viral copies (<131 copies/mL). A negative result must be combined with clinical observations, patient history, and epidemiological information. The expected result is Negative. Fact Sheet for Patients:  https://www.moore.com/ Fact Sheet for Healthcare Providers:  https://www.young.biz/ This test is not yet ap proved or cleared by the [place] FDA and  has been authorized for detection and/or diagnosis of SARS-CoV-2 by FDA under an Emergency Use Authorization (EUA). This EUA will remain  in effect (meaning this test can be used) for the duration of the COVID-19 declaration under Section 564(b)(1) of the Act, 21 U.S.C. section 360bbb-3(b)(1), unless the authorization is terminated or revoked sooner.    Influenza A by PCR NEGATIVE NEGATIVE Final   Influenza B by PCR NEGATIVE NEGATIVE Final    Comment: (NOTE) The Xpert Xpress SARS-CoV-2/FLU/RSV assay is intended as an aid in  the diagnosis of influenza from Nasopharyngeal swab specimens and  should not be used as a sole basis for treatment. Nasal washings and  aspirates are unacceptable for Xpert Xpress SARS-CoV-2/FLU/RSV  testing. Fact Sheet for Patients: https://www.moore.com/ Fact Sheet for Healthcare Providers: https://www.young.biz/ This test is not yet approved or cleared by the [place] FDA and  has been authorized for detection and/or diagnosis of SARS-CoV-2 by  FDA under an Emergency Use Authorization (EUA). This EUA will remain  in effect (meaning this test can be used) for the duration of the  Covid-19 declaration under Section 564(b)(1) of the Act, 21  U.S.C.  section 360bbb-3(b)(1), unless the authorization is  terminated or revoked. Performed at [facility], [address], [place], [place] [address]     Procedures/Studies: CT Angio Chest PE W/Cm &/Or Wo Cm  Result Date: 10/18/2019 CLINICAL DATA:  Shortness of breath with hypoxia. Possible pulmonary embolism. EXAM: CT ANGIOGRAPHY CHEST WITH CONTRAST TECHNIQUE: Multidetector CT imaging of the chest was  performed using the standard protocol during bolus administration of intravenous contrast. Multiplanar CT image reconstructions and MIPs were obtained to evaluate the vascular anatomy. CONTRAST:  OMNIPAQUE IOHEXOL 350 MG/ML SOLN COMPARISON:  Chest x-ray today. FINDINGS: Cardiovascular: Mild-to-moderate cardiomegaly. Moderate calcified plaque over the left main and 3 vessel coronary arteries. No evidence of thoracic aortic aneurysm. There is calcified plaque throughout the thoracic aorta. Pulmonary arterial system is well opacified without evidence of emboli. Remaining vascular structures are unremarkable. Mediastinum/Nodes: No mediastinal or hilar adenopathy. Remaining mediastinal structures are unremarkable. Lungs/Pleura: Lungs are adequately inflated with mild to moderate centrilobular emphysematous disease. No focal lobar consolidation. Small bilateral pleural effusions with associated basilar atelectasis. 3 mm sub solid nodule over the posterior left upper lobe. Small amount of aspirate material within the proximal airways bilaterally. Upper Abdomen: Upper pole right renal cyst. Mild prominence of the intrarenal collecting systems bilaterally. Moderate to severe calcified plaque over the abdominal aorta. Musculoskeletal: Minimal degenerative change of the spine. Review of the MIP images confirms the above findings. IMPRESSION: 1.  No evidence of pulmonary embolism. 2. Small bilateral pleural effusions with associated bibasilar atelectasis. Small amount of aspirate material over the proximal bronchi  bilaterally. 3. Aortic Atherosclerosis (ICD10-I70.0) and Emphysema (ICD10-J43.9). Atherosclerotic coronary artery disease. 4.  Cardiomegaly. 5. Right renal cyst. Mild prominence of the intrarenal collecting systems bilaterally. Electronically Signed   By: Elberta Fortis M.D.   On: 10/18/2019 15:20   NM Myocar Multi W/Spect W/Wall Motion / EF  Result Date: 10/21/2019 CLINICAL DATA:  Chest pain. History of CAD, post median sternotomy and CABG. EXAM: MYOCARDIAL IMAGING WITH SPECT (REST AND PHARMACOLOGIC-STRESS) GATED LEFT VENTRICULAR WALL MOTION STUDY LEFT VENTRICULAR EJECTION FRACTION TECHNIQUE: Standard myocardial SPECT imaging was performed after resting intravenous injection of 10 mCi Tc-93m tetrofosmin. Subsequently, intravenous infusion of Lexiscan was performed under the supervision of the Cardiology staff. At peak effect of the drug, 30 mCi Tc-50m tetrofosmin was injected intravenously and standard myocardial SPECT imaging was performed. Quantitative gated imaging was also performed to evaluate left ventricular wall motion, and estimate left ventricular ejection fraction. COMPARISON:  Chest radiograph-10/18/2019; chest CT-10/18/2019 FINDINGS: Raw images: Radiotracer is seen about the right upper extremity IV injection site. A minimal amount of GI attenuation is seen, worse on the provided rest images. There is no significant patient motion artifact. There is no significant chest wall attenuation. Perfusion: Matched large area of non perfusion involving the left ventricular apex suggestive of previous infarction. There are no mismatched perfusion defects on the provided stress images to suggest pharmacologically induced ischemia. Wall Motion: The left ventricle is moderately dilated. There is global hypokinesia with relative akinesia involving the septum. Left Ventricular Ejection Fraction: 27 % End diastolic volume 125 ml End systolic volume 91 ml IMPRESSION: 1. Suspected large territory infarct involving the  left ventricular apex. 2. No definitive scintigraphic evidence of pharmacologically induced ischemia. 3. Moderate left ventricular dilatation with global hypokinesia and relative akinesia involving the septum. 4. Ejection fraction - 27%. Electronically Signed   By: Simonne Come M.D.   On: 10/21/2019 12:32   DG CHEST PORT 1 VIEW  Result Date: 10/24/2019 CLINICAL DATA:  Shortness of breath.  Coughing. EXAM: PORTABLE CHEST 1 VIEW COMPARISON:  CT 10/18/2019.  Chest x-ray 10/18/2019. FINDINGS: Prior CABG. Heart size normal. Lungs are clear. Interval clearing of previously identified interstitial prominence. Small bilateral pleural effusions, improved from prior exam. No pneumothorax. No acute bony abnormality. Carotid vascular calcification. IMPRESSION: Prior CABG. Cardiomegaly. Interim clearing of bilateral  interstitial prominence consistent with resolution of pulmonary interstitial edema. Tiny residual bilateral pleural effusions. Electronically Signed   By: Maisie Fus  Register   On: 10/24/2019 11:45   DG Chest Port 1 View  Result Date: 10/18/2019 CLINICAL DATA:  Shortness of breath EXAM: PORTABLE CHEST 1 VIEW COMPARISON:  None. FINDINGS: Probable background COPD with likely chronic interstitial prominence. Small left pleural effusion with left basilar atelectasis. Cardiomediastinal contours are within normal limits normal heart size and evidence of CABG. There is calcified plaque along the aortic arch. IMPRESSION: Probable COPD with likely chronic interstitial prominence. Superimposed acute interstitial process is difficult to exclude. Small left pleural effusion with left basilar atelectasis. Electronically Signed   By: Guadlupe Spanish M.D.   On: 10/18/2019 11:47   DG Swallowing Func-Speech Pathology  Result Date: 10/22/2019 Objective Swallowing Evaluation: Type of Study: MBS-Modified Barium Swallow Study  Patient Details Name: OBERA STAUCH MRN: [id number] Date of Birth: 1934/01/20 Today's Date: 10/22/2019 Time:  SLP Start Time (ACUTE ONLY): 1040 -SLP Stop Time (ACUTE ONLY): 1058 SLP Time Calculation (min) (ACUTE ONLY): 18 min Past Medical History: Past Medical History: Diagnosis Date . CHF (congestive heart failure) (HCC)  . COPD (chronic obstructive pulmonary disease) (HCC)  . Hypertension  Past Surgical History: Past Surgical History: Procedure Laterality Date . CORONARY ARTERY BYPASS GRAFT   HPI: CONCETTA GUION is a 86 y.o. female with medical history significant for COPD, CABG, hypertension admitted for reports of difficulty breathing. per char patient reports recently she has been having some difficulty swallowing especially her pills. Chest x-ray shows chronic findings suggestive of COPD.  CTA chest shows small pleural effusions, basilar atelectasis. Small amount of aspirate material in bilateral proximal bronchi.  No data recorded Assessment / Plan / Recommendation CHL IP CLINICAL IMPRESSIONS 10/22/2019 Clinical Impression Pt was seen in radiology suite for modified barium swallow study. Trials were limited to thin liquids and puree solids. Pt requested that the study be terminated since she found the barium to be noxious and was fearful of needing to vomit after the initial trials. Pt's oropharyngeal swallow mechanism was within functional limits with inconsistent premature spillage to the pyriform sinuses. Esophageal screening revealed retention of barium in the milddle to lower thoracic esophagus with backflow. Instances of penetration/aspiration were not demonstrated during the study but the possibility of backflow reaching the cervical esophagus/pharynx and ultimately leading to aspiration is considered. Further skilled SLP services are not clinically indicated at this time. Assessment of the esophageal phase of the swallow may be beneficial but the pt's willingness to consume barium for an additional assessment is questioned.  SLP Visit Diagnosis Dysphagia, unspecified (R13.10) Attention and concentration  deficit following -- Frontal lobe and executive function deficit following -- Impact on safety and function Mild aspiration risk   CHL IP TREATMENT RECOMMENDATION 10/22/2019 Treatment Recommendations No treatment recommended at this time   Prognosis 10/22/2019 Prognosis for Safe Diet Advancement Good Barriers to Reach Goals -- Barriers/Prognosis Comment -- CHL IP DIET RECOMMENDATION 10/22/2019 SLP Diet Recommendations Regular solids;Thin liquid Liquid Administration via Cup;Straw Medication Administration Whole meds with liquid Compensations Slow rate;Small sips/bites;Follow solids with liquid Postural Changes Seated upright at 90 degrees;Remain semi-upright after after feeds/meals (Comment)   CHL IP OTHER RECOMMENDATIONS 10/22/2019 Recommended Consults Consider esophageal assessment Oral Care Recommendations Oral care BID Other Recommendations --   CHL IP FOLLOW UP RECOMMENDATIONS 10/22/2019 Follow up Recommendations None   CHL IP FREQUENCY AND DURATION 10/19/2019 Speech Therapy Frequency (ACUTE ONLY) min 1 x/week Treatment Duration 1  week      CHL IP ORAL PHASE 10/22/2019 Oral Phase WFL Oral - Pudding Teaspoon -- Oral - Pudding Cup -- Oral - Honey Teaspoon -- Oral - Honey Cup -- Oral - Nectar Teaspoon -- Oral - Nectar Cup -- Oral - Nectar Straw -- Oral - Thin Teaspoon -- Oral - Thin Cup -- Oral - Thin Straw -- Oral - Puree -- Oral - Mech Soft -- Oral - Regular -- Oral - Multi-Consistency -- Oral - Pill -- Oral Phase - Comment --  CHL IP PHARYNGEAL PHASE 10/22/2019 Pharyngeal Phase WFL Pharyngeal- Pudding Teaspoon -- Pharyngeal -- Pharyngeal- Pudding Cup -- Pharyngeal -- Pharyngeal- Honey Teaspoon -- Pharyngeal -- Pharyngeal- Honey Cup -- Pharyngeal -- Pharyngeal- Nectar Teaspoon -- Pharyngeal -- Pharyngeal- Nectar Cup -- Pharyngeal -- Pharyngeal- Nectar Straw -- Pharyngeal -- Pharyngeal- Thin Teaspoon -- Pharyngeal -- Pharyngeal- Thin Cup -- Pharyngeal -- Pharyngeal- Thin Straw -- Pharyngeal -- Pharyngeal- Puree -- Pharyngeal --  Pharyngeal- Mechanical Soft -- Pharyngeal -- Pharyngeal- Regular -- Pharyngeal -- Pharyngeal- Multi-consistency -- Pharyngeal -- Pharyngeal- Pill -- Pharyngeal -- Pharyngeal Comment --  CHL IP CERVICAL ESOPHAGEAL PHASE 10/22/2019 Cervical Esophageal Phase Impaired Pudding Teaspoon -- Pudding Cup -- Honey Teaspoon -- Honey Cup -- Nectar Teaspoon -- Nectar Cup -- Nectar Straw -- Thin Teaspoon -- Thin Cup -- Thin Straw -- Puree -- Mechanical Soft -- Regular -- Multi-consistency -- Pill -- Cervical Esophageal Comment Retention of barium in thoracic esophagus with backflow Shanika I. Hardin Negus, [place], [place] Office number [phone] Pager [phone] Horton Marshall 10/22/2019, 12:09 PM              ECHOCARDIOGRAM COMPLETE  Result Date: 10/19/2019    ECHOCARDIOGRAM REPORT   Patient Name:   MAYARA PAULSON Date of Exam: 10/19/2019 Medical Rec #:  [id number]      Height:       60.0 in Accession #:    [id number]     Weight:       80.5 lb Date of Birth:  10/23/1933      BSA:          1.268 m Patient Age:    30 years       BP:           145/73 mmHg Patient Gender: F              HR:           69 bpm. Exam Location:  Inpatient Procedure: 2D Echo, Cardiac Doppler and Color Doppler Indications:    Elevated troponin  History:        Patient has no prior history of Echocardiogram examinations.                 Prior CABG; Risk Factors:Hypertension.  Sonographer:    Clayton Lefort RDCS (AE) Referring Phys: [place]  1. Global hypokinesis with akinesis of the inferolateral wall and apex; overall severely reduced LV systolic function; grade 1 diastolic dysfunction; moderate LVH; trace AI; mild MR; mild LAE.  2. Left ventricular ejection fraction, by estimation, is 25 to 30%. The left ventricle has severely decreased function. The left ventricle demonstrates regional wall motion abnormalities (see scoring diagram/findings for description). There is moderate left ventricular  hypertrophy. Left ventricular diastolic parameters are consistent with Grade I diastolic dysfunction (impaired relaxation).  3. Right ventricular systolic function is normal. The right ventricular size is normal. There is normal pulmonary artery systolic pressure.  4. Left  atrial size was mildly dilated.  5. The mitral valve is normal in structure. Mild mitral valve regurgitation. No evidence of mitral stenosis.  6. The aortic valve is tricuspid. Aortic valve regurgitation is trivial. Mild to moderate aortic valve sclerosis/calcification is present, without any evidence of aortic stenosis.  7. The inferior vena cava is normal in size with greater than 50% respiratory variability, suggesting right atrial pressure of 3 mmHg. FINDINGS  Left Ventricle: Left ventricular ejection fraction, by estimation, is 25 to 30%. The left ventricle has severely decreased function. The left ventricle demonstrates regional wall motion abnormalities. The left ventricular internal cavity size was normal  in size. There is moderate left ventricular hypertrophy. Left ventricular diastolic parameters are consistent with Grade I diastolic dysfunction (impaired relaxation). Right Ventricle: The right ventricular size is normal. Right ventricular systolic function is normal. There is normal pulmonary artery systolic pressure. The tricuspid regurgitant velocity is 2.71 m/s, and with an assumed right atrial pressure of 3 mmHg,  the estimated right ventricular systolic pressure is 32.4 mmHg. Left Atrium: Left atrial size was mildly dilated. Right Atrium: Right atrial size was normal in size. Pericardium: There is no evidence of pericardial effusion. Mitral Valve: The mitral valve is normal in structure. Normal mobility of the mitral valve leaflets. Mild mitral valve regurgitation. No evidence of mitral valve stenosis. MV peak gradient, 2.2 mmHg. The mean mitral valve gradient is 1.0 mmHg. Tricuspid Valve: The tricuspid valve is normal in  structure. Tricuspid valve regurgitation is trivial. No evidence of tricuspid stenosis. Aortic Valve: The aortic valve is tricuspid. Aortic valve regurgitation is trivial. Aortic regurgitation PHT measures 452 msec. Mild to moderate aortic valve sclerosis/calcification is present, without any evidence of aortic stenosis. Aortic valve mean gradient measures 2.0 mmHg. Aortic valve peak gradient measures 3.8 mmHg. Aortic valve area, by VTI measures 1.68 cm. Pulmonic Valve: The pulmonic valve was not well visualized. Pulmonic valve regurgitation is not visualized. No evidence of pulmonic stenosis. Aorta: The aortic root is normal in size and structure. Venous: The inferior vena cava is normal in size with greater than 50% respiratory variability, suggesting right atrial pressure of 3 mmHg. IAS/Shunts: No atrial level shunt detected by color flow Doppler. Additional Comments: Global hypokinesis with akinesis of the inferolateral wall and apex; overall severely reduced LV systolic function; grade 1 diastolic dysfunction; moderate LVH; trace AI; mild MR; mild LAE.  LEFT VENTRICLE PLAX 2D LVIDd:         4.70 cm  Diastology LVIDs:         4.50 cm  LV e' lateral:   5.31 cm/s LV PW:         1.50 cm  LV E/e' lateral: 10.8 LV IVS:        1.90 cm  LV e' medial:    3.75 cm/s LVOT diam:     1.80 cm  LV E/e' medial:  15.3 LV SV:         35 LV SV Index:   27 LVOT Area:     2.54 cm  RIGHT VENTRICLE            IVC RV Basal diam:  3.30 cm    IVC diam: 1.40 cm RV S prime:     9.87 cm/s TAPSE (M-mode): 1.8 cm LEFT ATRIUM             Index       RIGHT ATRIUM           Index LA diam:  3.90 cm 3.08 cm/m  RA Area:     13.30 cm LA Vol (A2C):   45.9 ml 36.21 ml/m RA Volume:   30.90 ml  24.38 ml/m LA Vol (A4C):   44.8 ml 35.34 ml/m LA Biplane Vol: 45.8 ml 36.13 ml/m  AORTIC VALVE AV Area (Vmax):    1.72 cm AV Area (Vmean):   1.70 cm AV Area (VTI):     1.68 cm AV Vmax:           97.00 cm/s AV Vmean:          71.700 cm/s AV VTI:             0.206 m AV Peak Grad:      3.8 mmHg AV Mean Grad:      2.0 mmHg LVOT Vmax:         65.40 cm/s LVOT Vmean:        47.900 cm/s LVOT VTI:          0.136 m LVOT/AV VTI ratio: 0.66 AI PHT:            452 msec  AORTA Ao Root diam: 3.10 cm Ao Asc diam:  3.20 cm MITRAL VALVE               TRICUSPID VALVE MV Area (PHT): 2.80 cm    TR Peak grad:   29.4 mmHg MV Peak grad:  2.2 mmHg    TR Vmax:        271.00 cm/s MV Mean grad:  1.0 mmHg MV Vmax:       0.75 m/s    SHUNTS MV Vmean:      51.1 cm/s   Systemic VTI:  0.14 m MV Decel Time: 271 msec    Systemic Diam: 1.80 cm MR Peak grad: 100.0 mmHg MR Mean grad: 63.0 mmHg MR Vmax:      500.00 cm/s MR Vmean:     370.0 cm/s MV E velocity: 57.40 cm/s MV A velocity: 71.10 cm/s MV E/A ratio:  0.81 Olga Millers MD Electronically signed by Olga Millers MD Signature Date/Time: 10/19/2019/2:51:32 PM    Final     Labs: BNP (last 3 results) Recent Labs    10/23/19 0459 10/24/19 0731 10/25/19 0829  BNP 714.6* 309.3* 452.5*   Basic Metabolic Panel: Recent Labs  Lab 10/20/19 0617 10/21/19 0505 10/21/19 0711 10/22/19 8921 10/23/19 0459 10/24/19 0731 10/25/19 0829  NA 136   < > 135 138 139 139 139  K 3.6   < > 3.7 3.4* 3.6 3.5 3.7  CL 97*   < > 99 100 102 103 101  CO2 28   < > 28 28 30 27 28   GLUCOSE 110*   < > 107* 98 121* 99 175*  BUN 27*   < > 34* 31* 32* 35* 36*  CREATININE 1.24*   < > 0.92 0.97 1.08* 1.06* 1.22*  CALCIUM 9.0   < > 8.9 8.8* 8.8* 8.7* 9.1  MG 1.9  --   --   --   --   --   --    < > = values in this interval not displayed.   Liver Function Tests: Recent Labs  Lab 10/18/19 1112 10/24/19 0909  AST 36 22  ALT 28 20  ALKPHOS 85 62  BILITOT 0.8 0.7  PROT 6.8 5.7*  ALBUMIN 4.2 3.4*   Recent Labs  Lab 10/24/19 0909  LIPASE 23   No results for input(s): AMMONIA in the last 168  hours. CBC: Recent Labs  Lab 10/18/19 1112 10/19/19 0612 10/20/19 0617 10/23/19 0459  WBC 14.9* 10.0 9.1 9.7  NEUTROABS 12.9*  --   --   --    HGB 14.0 13.5 13.4 14.4  HCT 44.5 40.6 40.5 43.7  MCV 107.0* 99.3 100.2* 100.5*  PLT 165 152 160 170   Cardiac Enzymes: No results for input(s): CKTOTAL, CKMB, CKMBINDEX, TROPONINI in the last 168 hours. BNP: Invalid input(s): POCBNP CBG: No results for input(s): GLUCAP in the last 168 hours. D-Dimer No results for input(s): DDIMER in the last 72 hours. Hgb A1c No results for input(s): HGBA1C in the last 72 hours. Lipid Profile No results for input(s): CHOL, HDL, LDLCALC, TRIG, CHOLHDL, LDLDIRECT in the last 72 hours. Thyroid function studies No results for input(s): TSH, T4TOTAL, T3FREE, THYROIDAB in the last 72 hours.  Invalid input(s): FREET3 Anemia work up No results for input(s): VITAMINB12, FOLATE, FERRITIN, TIBC, IRON, RETICCTPCT in the last 72 hours. Urinalysis    Component Value Date/Time   COLORURINE YELLOW 10/18/2019 1229   APPEARANCEUR CLEAR 10/18/2019 1229   LABSPEC 1.012 10/18/2019 1229   PHURINE 5.0 10/18/2019 1229   GLUCOSEU 150 (A) 10/18/2019 1229   HGBUR MODERATE (A) 10/18/2019 1229   BILIRUBINUR NEGATIVE 10/18/2019 1229   KETONESUR NEGATIVE 10/18/2019 1229   PROTEINUR 100 (A) 10/18/2019 1229   NITRITE NEGATIVE 10/18/2019 1229   LEUKOCYTESUR NEGATIVE 10/18/2019 1229   Sepsis Labs Invalid input(s): PROCALCITONIN,  WBC,  LACTICIDVEN Microbiology Recent Results (from the past 240 hour(s))  Respiratory Panel by RT PCR (Flu A&B, Covid) - Nasopharyngeal Swab     Status: None   Collection Time: 10/18/19 12:04 PM   Specimen: Nasopharyngeal Swab  Result Value Ref Range Status   SARS Coronavirus 2 by RT PCR NEGATIVE NEGATIVE Final    Comment: (NOTE) SARS-CoV-2 target nucleic acids are NOT DETECTED. The SARS-CoV-2 RNA is generally detectable in upper respiratoy specimens during the acute phase of infection. The lowest concentration of SARS-CoV-2 viral copies this assay can detect is 131 copies/mL. A negative result does not preclude SARS-Cov-2 infection  and should not be used as the sole basis for treatment or other patient management decisions. A negative result may occur with  improper specimen collection/handling, submission of specimen other than nasopharyngeal swab, presence of viral mutation(s) within the areas targeted by this assay, and inadequate number of viral copies (<131 copies/mL). A negative result must be combined with clinical observations, patient history, and epidemiological information. The expected result is Negative. Fact Sheet for Patients:  https://www.moore.com/ Fact Sheet for Healthcare Providers:  https://www.young.biz/ This test is not yet ap proved or cleared by the [place] FDA and  has been authorized for detection and/or diagnosis of SARS-CoV-2 by FDA under an Emergency Use Authorization (EUA). This EUA will remain  in effect (meaning this test can be used) for the duration of the COVID-19 declaration under Section 564(b)(1) of the Act, 21 U.S.C. section 360bbb-3(b)(1), unless the authorization is terminated or revoked sooner.    Influenza A by PCR NEGATIVE NEGATIVE Final   Influenza B by PCR NEGATIVE NEGATIVE Final    Comment: (NOTE) The Xpert Xpress SARS-CoV-2/FLU/RSV assay is intended as an aid in  the diagnosis of influenza from Nasopharyngeal swab specimens and  should not be used as a sole basis for treatment. Nasal washings and  aspirates are unacceptable for Xpert Xpress SARS-CoV-2/FLU/RSV  testing. Fact Sheet for Patients: https://www.moore.com/ Fact Sheet for Healthcare Providers: https://www.young.biz/ This test is not yet  approved or cleared by the [place] and  has been authorized for detection and/or diagnosis of SARS-CoV-2 by  FDA under an Emergency Use Authorization (EUA). This EUA will remain  in effect (meaning this test can be used) for the duration of the  Covid-19 declaration under Section  564(b)(1) of the Act, 21  U.S.C. section 360bbb-3(b)(1), unless the authorization is  terminated or revoked. Performed at [facility], [address] [place], [place] [address]      Time coordinating discharge: 35 minutes  SIGNED: Lanae Boast, MD  [facility] 10/25/2019, 10:58 AM  If 7PM-7AM, please contact night-coverage [email or web address]

## 2019-10-25 NOTE — Progress Notes (Signed)
Patient and daughter educated on d/c papers, questions answered. IVs removed. Taken by wheelchair to patient pick up.

## 2019-10-25 NOTE — Plan of Care (Signed)
  Problem: Education: Goal: Knowledge of General Education information will improve Description: Including pain rating scale, medication(s)/side effects and non-pharmacologic comfort measures Outcome: Adequate for Discharge   Problem: Health Behavior/Discharge Planning: Goal: Ability to manage health-related needs will improve Outcome: Adequate for Discharge   Problem: Clinical Measurements: Goal: Ability to maintain clinical measurements within normal limits will improve Outcome: Adequate for Discharge Goal: Will remain free from infection Outcome: Adequate for Discharge Goal: Diagnostic test results will improve Outcome: Adequate for Discharge Goal: Respiratory complications will improve Outcome: Adequate for Discharge Goal: Cardiovascular complication will be avoided Outcome: Adequate for Discharge   Problem: Activity: Goal: Risk for activity intolerance will decrease Outcome: Adequate for Discharge   Problem: Nutrition: Goal: Adequate nutrition will be maintained Outcome: Adequate for Discharge   Problem: Coping: Goal: Level of anxiety will decrease Outcome: Adequate for Discharge   Problem: Elimination: Goal: Will not experience complications related to bowel motility Outcome: Adequate for Discharge Goal: Will not experience complications related to urinary retention Outcome: Adequate for Discharge   Problem: Pain Managment: Goal: General experience of comfort will improve Outcome: Adequate for Discharge   Problem: Safety: Goal: Ability to remain free from injury will improve Outcome: Adequate for Discharge   Problem: Skin Integrity: Goal: Risk for impaired skin integrity will decrease Outcome: Adequate for Discharge   Problem: Education: Goal: Ability to demonstrate management of disease process will improve Outcome: Adequate for Discharge Goal: Ability to verbalize understanding of medication therapies will improve Outcome: Adequate for Discharge    Problem: Activity: Goal: Capacity to carry out activities will improve Outcome: Adequate for Discharge   Problem: Cardiac: Goal: Ability to achieve and maintain adequate cardiopulmonary perfusion will improve Outcome: Adequate for Discharge   

## 2019-10-30 DIAGNOSIS — I10 Essential (primary) hypertension: Secondary | ICD-10-CM | POA: Diagnosis not present

## 2019-10-30 DIAGNOSIS — I25119 Atherosclerotic heart disease of native coronary artery with unspecified angina pectoris: Secondary | ICD-10-CM | POA: Diagnosis not present

## 2019-10-30 DIAGNOSIS — J410 Simple chronic bronchitis: Secondary | ICD-10-CM | POA: Diagnosis not present

## 2019-10-30 DIAGNOSIS — I5022 Chronic systolic (congestive) heart failure: Secondary | ICD-10-CM | POA: Diagnosis not present

## 2019-10-30 DIAGNOSIS — E785 Hyperlipidemia, unspecified: Secondary | ICD-10-CM | POA: Diagnosis not present

## 2019-11-05 DIAGNOSIS — I10 Essential (primary) hypertension: Secondary | ICD-10-CM | POA: Diagnosis not present

## 2019-11-05 DIAGNOSIS — I5022 Chronic systolic (congestive) heart failure: Secondary | ICD-10-CM | POA: Diagnosis not present

## 2019-11-05 DIAGNOSIS — I2510 Atherosclerotic heart disease of native coronary artery without angina pectoris: Secondary | ICD-10-CM | POA: Diagnosis not present

## 2019-11-05 DIAGNOSIS — J449 Chronic obstructive pulmonary disease, unspecified: Secondary | ICD-10-CM | POA: Diagnosis not present

## 2019-11-05 DIAGNOSIS — I252 Old myocardial infarction: Secondary | ICD-10-CM | POA: Diagnosis not present

## 2019-11-08 DIAGNOSIS — Z951 Presence of aortocoronary bypass graft: Secondary | ICD-10-CM | POA: Diagnosis not present

## 2019-11-08 DIAGNOSIS — Z9181 History of falling: Secondary | ICD-10-CM | POA: Diagnosis not present

## 2019-11-08 DIAGNOSIS — J432 Centrilobular emphysema: Secondary | ICD-10-CM | POA: Diagnosis not present

## 2019-11-08 DIAGNOSIS — I5022 Chronic systolic (congestive) heart failure: Secondary | ICD-10-CM | POA: Diagnosis not present

## 2019-11-08 DIAGNOSIS — Z9981 Dependence on supplemental oxygen: Secondary | ICD-10-CM | POA: Diagnosis not present

## 2019-11-08 DIAGNOSIS — E039 Hypothyroidism, unspecified: Secondary | ICD-10-CM | POA: Diagnosis not present

## 2019-11-08 DIAGNOSIS — I700 Atherosclerosis of aorta: Secondary | ICD-10-CM | POA: Diagnosis not present

## 2019-11-08 DIAGNOSIS — I252 Old myocardial infarction: Secondary | ICD-10-CM | POA: Diagnosis not present

## 2019-11-08 DIAGNOSIS — Z87891 Personal history of nicotine dependence: Secondary | ICD-10-CM | POA: Diagnosis not present

## 2019-11-08 DIAGNOSIS — I219 Acute myocardial infarction, unspecified: Secondary | ICD-10-CM | POA: Diagnosis not present

## 2019-11-08 DIAGNOSIS — I110 Hypertensive heart disease with heart failure: Secondary | ICD-10-CM | POA: Diagnosis not present

## 2019-11-08 DIAGNOSIS — Z7982 Long term (current) use of aspirin: Secondary | ICD-10-CM | POA: Diagnosis not present

## 2019-11-08 DIAGNOSIS — I2510 Atherosclerotic heart disease of native coronary artery without angina pectoris: Secondary | ICD-10-CM | POA: Diagnosis not present

## 2019-11-08 DIAGNOSIS — Z7951 Long term (current) use of inhaled steroids: Secondary | ICD-10-CM | POA: Diagnosis not present

## 2019-11-12 DIAGNOSIS — I2510 Atherosclerotic heart disease of native coronary artery without angina pectoris: Secondary | ICD-10-CM | POA: Diagnosis not present

## 2019-11-12 DIAGNOSIS — I110 Hypertensive heart disease with heart failure: Secondary | ICD-10-CM | POA: Diagnosis not present

## 2019-11-12 DIAGNOSIS — Z7951 Long term (current) use of inhaled steroids: Secondary | ICD-10-CM | POA: Diagnosis not present

## 2019-11-12 DIAGNOSIS — Z7982 Long term (current) use of aspirin: Secondary | ICD-10-CM | POA: Diagnosis not present

## 2019-11-12 DIAGNOSIS — Z9981 Dependence on supplemental oxygen: Secondary | ICD-10-CM | POA: Diagnosis not present

## 2019-11-12 DIAGNOSIS — I252 Old myocardial infarction: Secondary | ICD-10-CM | POA: Diagnosis not present

## 2019-11-12 DIAGNOSIS — I219 Acute myocardial infarction, unspecified: Secondary | ICD-10-CM | POA: Diagnosis not present

## 2019-11-12 DIAGNOSIS — Z87891 Personal history of nicotine dependence: Secondary | ICD-10-CM | POA: Diagnosis not present

## 2019-11-12 DIAGNOSIS — Z951 Presence of aortocoronary bypass graft: Secondary | ICD-10-CM | POA: Diagnosis not present

## 2019-11-12 DIAGNOSIS — I5022 Chronic systolic (congestive) heart failure: Secondary | ICD-10-CM | POA: Diagnosis not present

## 2019-11-12 DIAGNOSIS — I700 Atherosclerosis of aorta: Secondary | ICD-10-CM | POA: Diagnosis not present

## 2019-11-12 DIAGNOSIS — Z9181 History of falling: Secondary | ICD-10-CM | POA: Diagnosis not present

## 2019-11-12 DIAGNOSIS — E039 Hypothyroidism, unspecified: Secondary | ICD-10-CM | POA: Diagnosis not present

## 2019-11-12 DIAGNOSIS — J432 Centrilobular emphysema: Secondary | ICD-10-CM | POA: Diagnosis not present

## 2019-11-19 DIAGNOSIS — Z951 Presence of aortocoronary bypass graft: Secondary | ICD-10-CM | POA: Diagnosis not present

## 2019-11-19 DIAGNOSIS — I700 Atherosclerosis of aorta: Secondary | ICD-10-CM | POA: Diagnosis not present

## 2019-11-19 DIAGNOSIS — Z7982 Long term (current) use of aspirin: Secondary | ICD-10-CM | POA: Diagnosis not present

## 2019-11-19 DIAGNOSIS — J432 Centrilobular emphysema: Secondary | ICD-10-CM | POA: Diagnosis not present

## 2019-11-19 DIAGNOSIS — E039 Hypothyroidism, unspecified: Secondary | ICD-10-CM | POA: Diagnosis not present

## 2019-11-19 DIAGNOSIS — Z87891 Personal history of nicotine dependence: Secondary | ICD-10-CM | POA: Diagnosis not present

## 2019-11-19 DIAGNOSIS — I219 Acute myocardial infarction, unspecified: Secondary | ICD-10-CM | POA: Diagnosis not present

## 2019-11-19 DIAGNOSIS — Z9181 History of falling: Secondary | ICD-10-CM | POA: Diagnosis not present

## 2019-11-19 DIAGNOSIS — I252 Old myocardial infarction: Secondary | ICD-10-CM | POA: Diagnosis not present

## 2019-11-19 DIAGNOSIS — I5022 Chronic systolic (congestive) heart failure: Secondary | ICD-10-CM | POA: Diagnosis not present

## 2019-11-19 DIAGNOSIS — Z9981 Dependence on supplemental oxygen: Secondary | ICD-10-CM | POA: Diagnosis not present

## 2019-11-19 DIAGNOSIS — I110 Hypertensive heart disease with heart failure: Secondary | ICD-10-CM | POA: Diagnosis not present

## 2019-11-19 DIAGNOSIS — Z7951 Long term (current) use of inhaled steroids: Secondary | ICD-10-CM | POA: Diagnosis not present

## 2019-11-19 DIAGNOSIS — I2510 Atherosclerotic heart disease of native coronary artery without angina pectoris: Secondary | ICD-10-CM | POA: Diagnosis not present

## 2019-11-23 DIAGNOSIS — I5043 Acute on chronic combined systolic (congestive) and diastolic (congestive) heart failure: Secondary | ICD-10-CM | POA: Diagnosis not present

## 2019-11-23 DIAGNOSIS — J441 Chronic obstructive pulmonary disease with (acute) exacerbation: Secondary | ICD-10-CM | POA: Diagnosis not present

## 2019-11-27 DIAGNOSIS — Z7982 Long term (current) use of aspirin: Secondary | ICD-10-CM | POA: Diagnosis not present

## 2019-11-27 DIAGNOSIS — I219 Acute myocardial infarction, unspecified: Secondary | ICD-10-CM | POA: Diagnosis not present

## 2019-11-27 DIAGNOSIS — Z9981 Dependence on supplemental oxygen: Secondary | ICD-10-CM | POA: Diagnosis not present

## 2019-11-27 DIAGNOSIS — Z951 Presence of aortocoronary bypass graft: Secondary | ICD-10-CM | POA: Diagnosis not present

## 2019-11-27 DIAGNOSIS — I110 Hypertensive heart disease with heart failure: Secondary | ICD-10-CM | POA: Diagnosis not present

## 2019-11-27 DIAGNOSIS — I2510 Atherosclerotic heart disease of native coronary artery without angina pectoris: Secondary | ICD-10-CM | POA: Diagnosis not present

## 2019-11-27 DIAGNOSIS — Z87891 Personal history of nicotine dependence: Secondary | ICD-10-CM | POA: Diagnosis not present

## 2019-11-27 DIAGNOSIS — I5022 Chronic systolic (congestive) heart failure: Secondary | ICD-10-CM | POA: Diagnosis not present

## 2019-11-27 DIAGNOSIS — I252 Old myocardial infarction: Secondary | ICD-10-CM | POA: Diagnosis not present

## 2019-11-27 DIAGNOSIS — I700 Atherosclerosis of aorta: Secondary | ICD-10-CM | POA: Diagnosis not present

## 2019-11-27 DIAGNOSIS — E039 Hypothyroidism, unspecified: Secondary | ICD-10-CM | POA: Diagnosis not present

## 2019-11-27 DIAGNOSIS — J432 Centrilobular emphysema: Secondary | ICD-10-CM | POA: Diagnosis not present

## 2019-11-27 DIAGNOSIS — Z7951 Long term (current) use of inhaled steroids: Secondary | ICD-10-CM | POA: Diagnosis not present

## 2019-11-27 DIAGNOSIS — Z9181 History of falling: Secondary | ICD-10-CM | POA: Diagnosis not present

## 2019-12-02 DIAGNOSIS — I219 Acute myocardial infarction, unspecified: Secondary | ICD-10-CM | POA: Diagnosis not present

## 2019-12-02 DIAGNOSIS — E039 Hypothyroidism, unspecified: Secondary | ICD-10-CM | POA: Diagnosis not present

## 2019-12-02 DIAGNOSIS — Z9181 History of falling: Secondary | ICD-10-CM | POA: Diagnosis not present

## 2019-12-02 DIAGNOSIS — I2510 Atherosclerotic heart disease of native coronary artery without angina pectoris: Secondary | ICD-10-CM | POA: Diagnosis not present

## 2019-12-02 DIAGNOSIS — J432 Centrilobular emphysema: Secondary | ICD-10-CM | POA: Diagnosis not present

## 2019-12-02 DIAGNOSIS — I252 Old myocardial infarction: Secondary | ICD-10-CM | POA: Diagnosis not present

## 2019-12-02 DIAGNOSIS — Z7982 Long term (current) use of aspirin: Secondary | ICD-10-CM | POA: Diagnosis not present

## 2019-12-02 DIAGNOSIS — Z951 Presence of aortocoronary bypass graft: Secondary | ICD-10-CM | POA: Diagnosis not present

## 2019-12-02 DIAGNOSIS — Z9981 Dependence on supplemental oxygen: Secondary | ICD-10-CM | POA: Diagnosis not present

## 2019-12-02 DIAGNOSIS — Z7951 Long term (current) use of inhaled steroids: Secondary | ICD-10-CM | POA: Diagnosis not present

## 2019-12-02 DIAGNOSIS — I5022 Chronic systolic (congestive) heart failure: Secondary | ICD-10-CM | POA: Diagnosis not present

## 2019-12-02 DIAGNOSIS — I700 Atherosclerosis of aorta: Secondary | ICD-10-CM | POA: Diagnosis not present

## 2019-12-02 DIAGNOSIS — I110 Hypertensive heart disease with heart failure: Secondary | ICD-10-CM | POA: Diagnosis not present

## 2019-12-02 DIAGNOSIS — Z87891 Personal history of nicotine dependence: Secondary | ICD-10-CM | POA: Diagnosis not present

## 2019-12-04 DIAGNOSIS — I219 Acute myocardial infarction, unspecified: Secondary | ICD-10-CM | POA: Diagnosis not present

## 2019-12-04 DIAGNOSIS — I2510 Atherosclerotic heart disease of native coronary artery without angina pectoris: Secondary | ICD-10-CM | POA: Diagnosis not present

## 2019-12-04 DIAGNOSIS — Z87891 Personal history of nicotine dependence: Secondary | ICD-10-CM | POA: Diagnosis not present

## 2019-12-04 DIAGNOSIS — I5022 Chronic systolic (congestive) heart failure: Secondary | ICD-10-CM | POA: Diagnosis not present

## 2019-12-04 DIAGNOSIS — Z7982 Long term (current) use of aspirin: Secondary | ICD-10-CM | POA: Diagnosis not present

## 2019-12-04 DIAGNOSIS — Z9181 History of falling: Secondary | ICD-10-CM | POA: Diagnosis not present

## 2019-12-04 DIAGNOSIS — I252 Old myocardial infarction: Secondary | ICD-10-CM | POA: Diagnosis not present

## 2019-12-04 DIAGNOSIS — Z9981 Dependence on supplemental oxygen: Secondary | ICD-10-CM | POA: Diagnosis not present

## 2019-12-04 DIAGNOSIS — I700 Atherosclerosis of aorta: Secondary | ICD-10-CM | POA: Diagnosis not present

## 2019-12-04 DIAGNOSIS — J432 Centrilobular emphysema: Secondary | ICD-10-CM | POA: Diagnosis not present

## 2019-12-04 DIAGNOSIS — E039 Hypothyroidism, unspecified: Secondary | ICD-10-CM | POA: Diagnosis not present

## 2019-12-04 DIAGNOSIS — I110 Hypertensive heart disease with heart failure: Secondary | ICD-10-CM | POA: Diagnosis not present

## 2019-12-04 DIAGNOSIS — Z7951 Long term (current) use of inhaled steroids: Secondary | ICD-10-CM | POA: Diagnosis not present

## 2019-12-04 DIAGNOSIS — Z951 Presence of aortocoronary bypass graft: Secondary | ICD-10-CM | POA: Diagnosis not present

## 2019-12-08 DIAGNOSIS — I5022 Chronic systolic (congestive) heart failure: Secondary | ICD-10-CM | POA: Diagnosis not present

## 2019-12-08 DIAGNOSIS — I110 Hypertensive heart disease with heart failure: Secondary | ICD-10-CM | POA: Diagnosis not present

## 2019-12-08 DIAGNOSIS — I2510 Atherosclerotic heart disease of native coronary artery without angina pectoris: Secondary | ICD-10-CM | POA: Diagnosis not present

## 2019-12-08 DIAGNOSIS — Z9181 History of falling: Secondary | ICD-10-CM | POA: Diagnosis not present

## 2019-12-08 DIAGNOSIS — Z87891 Personal history of nicotine dependence: Secondary | ICD-10-CM | POA: Diagnosis not present

## 2019-12-08 DIAGNOSIS — I700 Atherosclerosis of aorta: Secondary | ICD-10-CM | POA: Diagnosis not present

## 2019-12-08 DIAGNOSIS — E039 Hypothyroidism, unspecified: Secondary | ICD-10-CM | POA: Diagnosis not present

## 2019-12-08 DIAGNOSIS — J432 Centrilobular emphysema: Secondary | ICD-10-CM | POA: Diagnosis not present

## 2019-12-08 DIAGNOSIS — Z9981 Dependence on supplemental oxygen: Secondary | ICD-10-CM | POA: Diagnosis not present

## 2019-12-08 DIAGNOSIS — Z7951 Long term (current) use of inhaled steroids: Secondary | ICD-10-CM | POA: Diagnosis not present

## 2019-12-08 DIAGNOSIS — Z951 Presence of aortocoronary bypass graft: Secondary | ICD-10-CM | POA: Diagnosis not present

## 2019-12-08 DIAGNOSIS — Z7982 Long term (current) use of aspirin: Secondary | ICD-10-CM | POA: Diagnosis not present

## 2019-12-08 DIAGNOSIS — I219 Acute myocardial infarction, unspecified: Secondary | ICD-10-CM | POA: Diagnosis not present

## 2019-12-08 DIAGNOSIS — I252 Old myocardial infarction: Secondary | ICD-10-CM | POA: Diagnosis not present

## 2019-12-10 DIAGNOSIS — Z7189 Other specified counseling: Secondary | ICD-10-CM | POA: Diagnosis not present

## 2019-12-10 DIAGNOSIS — I219 Acute myocardial infarction, unspecified: Secondary | ICD-10-CM | POA: Diagnosis not present

## 2019-12-10 DIAGNOSIS — Z87891 Personal history of nicotine dependence: Secondary | ICD-10-CM | POA: Diagnosis not present

## 2019-12-10 DIAGNOSIS — Z7951 Long term (current) use of inhaled steroids: Secondary | ICD-10-CM | POA: Diagnosis not present

## 2019-12-10 DIAGNOSIS — I10 Essential (primary) hypertension: Secondary | ICD-10-CM | POA: Diagnosis not present

## 2019-12-10 DIAGNOSIS — J432 Centrilobular emphysema: Secondary | ICD-10-CM | POA: Diagnosis not present

## 2019-12-10 DIAGNOSIS — Z951 Presence of aortocoronary bypass graft: Secondary | ICD-10-CM | POA: Diagnosis not present

## 2019-12-10 DIAGNOSIS — Z9981 Dependence on supplemental oxygen: Secondary | ICD-10-CM | POA: Diagnosis not present

## 2019-12-10 DIAGNOSIS — I700 Atherosclerosis of aorta: Secondary | ICD-10-CM | POA: Diagnosis not present

## 2019-12-10 DIAGNOSIS — I2510 Atherosclerotic heart disease of native coronary artery without angina pectoris: Secondary | ICD-10-CM | POA: Diagnosis not present

## 2019-12-10 DIAGNOSIS — Z9181 History of falling: Secondary | ICD-10-CM | POA: Diagnosis not present

## 2019-12-10 DIAGNOSIS — I110 Hypertensive heart disease with heart failure: Secondary | ICD-10-CM | POA: Diagnosis not present

## 2019-12-10 DIAGNOSIS — E039 Hypothyroidism, unspecified: Secondary | ICD-10-CM | POA: Diagnosis not present

## 2019-12-10 DIAGNOSIS — Z7982 Long term (current) use of aspirin: Secondary | ICD-10-CM | POA: Diagnosis not present

## 2019-12-10 DIAGNOSIS — I5022 Chronic systolic (congestive) heart failure: Secondary | ICD-10-CM | POA: Diagnosis not present

## 2019-12-10 DIAGNOSIS — I252 Old myocardial infarction: Secondary | ICD-10-CM | POA: Diagnosis not present

## 2019-12-11 DIAGNOSIS — Z87891 Personal history of nicotine dependence: Secondary | ICD-10-CM | POA: Diagnosis not present

## 2019-12-11 DIAGNOSIS — I252 Old myocardial infarction: Secondary | ICD-10-CM | POA: Diagnosis not present

## 2019-12-11 DIAGNOSIS — Z9181 History of falling: Secondary | ICD-10-CM | POA: Diagnosis not present

## 2019-12-11 DIAGNOSIS — Z9981 Dependence on supplemental oxygen: Secondary | ICD-10-CM | POA: Diagnosis not present

## 2019-12-11 DIAGNOSIS — I700 Atherosclerosis of aorta: Secondary | ICD-10-CM | POA: Diagnosis not present

## 2019-12-11 DIAGNOSIS — Z7982 Long term (current) use of aspirin: Secondary | ICD-10-CM | POA: Diagnosis not present

## 2019-12-11 DIAGNOSIS — I110 Hypertensive heart disease with heart failure: Secondary | ICD-10-CM | POA: Diagnosis not present

## 2019-12-11 DIAGNOSIS — J432 Centrilobular emphysema: Secondary | ICD-10-CM | POA: Diagnosis not present

## 2019-12-11 DIAGNOSIS — I219 Acute myocardial infarction, unspecified: Secondary | ICD-10-CM | POA: Diagnosis not present

## 2019-12-11 DIAGNOSIS — I5022 Chronic systolic (congestive) heart failure: Secondary | ICD-10-CM | POA: Diagnosis not present

## 2019-12-11 DIAGNOSIS — Z951 Presence of aortocoronary bypass graft: Secondary | ICD-10-CM | POA: Diagnosis not present

## 2019-12-11 DIAGNOSIS — Z7951 Long term (current) use of inhaled steroids: Secondary | ICD-10-CM | POA: Diagnosis not present

## 2019-12-11 DIAGNOSIS — E039 Hypothyroidism, unspecified: Secondary | ICD-10-CM | POA: Diagnosis not present

## 2019-12-11 DIAGNOSIS — I2510 Atherosclerotic heart disease of native coronary artery without angina pectoris: Secondary | ICD-10-CM | POA: Diagnosis not present

## 2019-12-15 DIAGNOSIS — I700 Atherosclerosis of aorta: Secondary | ICD-10-CM | POA: Diagnosis not present

## 2019-12-15 DIAGNOSIS — Z9981 Dependence on supplemental oxygen: Secondary | ICD-10-CM | POA: Diagnosis not present

## 2019-12-15 DIAGNOSIS — E039 Hypothyroidism, unspecified: Secondary | ICD-10-CM | POA: Diagnosis not present

## 2019-12-15 DIAGNOSIS — I110 Hypertensive heart disease with heart failure: Secondary | ICD-10-CM | POA: Diagnosis not present

## 2019-12-15 DIAGNOSIS — Z7982 Long term (current) use of aspirin: Secondary | ICD-10-CM | POA: Diagnosis not present

## 2019-12-15 DIAGNOSIS — Z951 Presence of aortocoronary bypass graft: Secondary | ICD-10-CM | POA: Diagnosis not present

## 2019-12-15 DIAGNOSIS — I2510 Atherosclerotic heart disease of native coronary artery without angina pectoris: Secondary | ICD-10-CM | POA: Diagnosis not present

## 2019-12-15 DIAGNOSIS — Z9181 History of falling: Secondary | ICD-10-CM | POA: Diagnosis not present

## 2019-12-15 DIAGNOSIS — Z7951 Long term (current) use of inhaled steroids: Secondary | ICD-10-CM | POA: Diagnosis not present

## 2019-12-15 DIAGNOSIS — I252 Old myocardial infarction: Secondary | ICD-10-CM | POA: Diagnosis not present

## 2019-12-15 DIAGNOSIS — I219 Acute myocardial infarction, unspecified: Secondary | ICD-10-CM | POA: Diagnosis not present

## 2019-12-15 DIAGNOSIS — Z87891 Personal history of nicotine dependence: Secondary | ICD-10-CM | POA: Diagnosis not present

## 2019-12-15 DIAGNOSIS — I5022 Chronic systolic (congestive) heart failure: Secondary | ICD-10-CM | POA: Diagnosis not present

## 2019-12-15 DIAGNOSIS — J432 Centrilobular emphysema: Secondary | ICD-10-CM | POA: Diagnosis not present

## 2019-12-18 DIAGNOSIS — Z7951 Long term (current) use of inhaled steroids: Secondary | ICD-10-CM | POA: Diagnosis not present

## 2019-12-18 DIAGNOSIS — Z87891 Personal history of nicotine dependence: Secondary | ICD-10-CM | POA: Diagnosis not present

## 2019-12-18 DIAGNOSIS — J432 Centrilobular emphysema: Secondary | ICD-10-CM | POA: Diagnosis not present

## 2019-12-18 DIAGNOSIS — I110 Hypertensive heart disease with heart failure: Secondary | ICD-10-CM | POA: Diagnosis not present

## 2019-12-18 DIAGNOSIS — I5022 Chronic systolic (congestive) heart failure: Secondary | ICD-10-CM | POA: Diagnosis not present

## 2019-12-18 DIAGNOSIS — Z9981 Dependence on supplemental oxygen: Secondary | ICD-10-CM | POA: Diagnosis not present

## 2019-12-18 DIAGNOSIS — I700 Atherosclerosis of aorta: Secondary | ICD-10-CM | POA: Diagnosis not present

## 2019-12-18 DIAGNOSIS — I252 Old myocardial infarction: Secondary | ICD-10-CM | POA: Diagnosis not present

## 2019-12-18 DIAGNOSIS — Z9181 History of falling: Secondary | ICD-10-CM | POA: Diagnosis not present

## 2019-12-18 DIAGNOSIS — I219 Acute myocardial infarction, unspecified: Secondary | ICD-10-CM | POA: Diagnosis not present

## 2019-12-18 DIAGNOSIS — Z951 Presence of aortocoronary bypass graft: Secondary | ICD-10-CM | POA: Diagnosis not present

## 2019-12-18 DIAGNOSIS — I2510 Atherosclerotic heart disease of native coronary artery without angina pectoris: Secondary | ICD-10-CM | POA: Diagnosis not present

## 2019-12-18 DIAGNOSIS — E039 Hypothyroidism, unspecified: Secondary | ICD-10-CM | POA: Diagnosis not present

## 2019-12-18 DIAGNOSIS — Z7982 Long term (current) use of aspirin: Secondary | ICD-10-CM | POA: Diagnosis not present

## 2019-12-23 DIAGNOSIS — J432 Centrilobular emphysema: Secondary | ICD-10-CM | POA: Diagnosis not present

## 2019-12-23 DIAGNOSIS — I5022 Chronic systolic (congestive) heart failure: Secondary | ICD-10-CM | POA: Diagnosis not present

## 2019-12-23 DIAGNOSIS — E039 Hypothyroidism, unspecified: Secondary | ICD-10-CM | POA: Diagnosis not present

## 2019-12-23 DIAGNOSIS — I252 Old myocardial infarction: Secondary | ICD-10-CM | POA: Diagnosis not present

## 2019-12-23 DIAGNOSIS — I700 Atherosclerosis of aorta: Secondary | ICD-10-CM | POA: Diagnosis not present

## 2019-12-23 DIAGNOSIS — Z7951 Long term (current) use of inhaled steroids: Secondary | ICD-10-CM | POA: Diagnosis not present

## 2019-12-23 DIAGNOSIS — Z9181 History of falling: Secondary | ICD-10-CM | POA: Diagnosis not present

## 2019-12-23 DIAGNOSIS — I219 Acute myocardial infarction, unspecified: Secondary | ICD-10-CM | POA: Diagnosis not present

## 2019-12-23 DIAGNOSIS — I110 Hypertensive heart disease with heart failure: Secondary | ICD-10-CM | POA: Diagnosis not present

## 2019-12-23 DIAGNOSIS — Z9981 Dependence on supplemental oxygen: Secondary | ICD-10-CM | POA: Diagnosis not present

## 2019-12-23 DIAGNOSIS — J441 Chronic obstructive pulmonary disease with (acute) exacerbation: Secondary | ICD-10-CM | POA: Diagnosis not present

## 2019-12-23 DIAGNOSIS — Z7982 Long term (current) use of aspirin: Secondary | ICD-10-CM | POA: Diagnosis not present

## 2019-12-23 DIAGNOSIS — Z87891 Personal history of nicotine dependence: Secondary | ICD-10-CM | POA: Diagnosis not present

## 2019-12-23 DIAGNOSIS — I5043 Acute on chronic combined systolic (congestive) and diastolic (congestive) heart failure: Secondary | ICD-10-CM | POA: Diagnosis not present

## 2019-12-23 DIAGNOSIS — Z951 Presence of aortocoronary bypass graft: Secondary | ICD-10-CM | POA: Diagnosis not present

## 2019-12-23 DIAGNOSIS — I2510 Atherosclerotic heart disease of native coronary artery without angina pectoris: Secondary | ICD-10-CM | POA: Diagnosis not present

## 2019-12-25 DIAGNOSIS — I110 Hypertensive heart disease with heart failure: Secondary | ICD-10-CM | POA: Diagnosis not present

## 2019-12-25 DIAGNOSIS — Z7951 Long term (current) use of inhaled steroids: Secondary | ICD-10-CM | POA: Diagnosis not present

## 2019-12-25 DIAGNOSIS — I219 Acute myocardial infarction, unspecified: Secondary | ICD-10-CM | POA: Diagnosis not present

## 2019-12-25 DIAGNOSIS — I5022 Chronic systolic (congestive) heart failure: Secondary | ICD-10-CM | POA: Diagnosis not present

## 2019-12-25 DIAGNOSIS — Z951 Presence of aortocoronary bypass graft: Secondary | ICD-10-CM | POA: Diagnosis not present

## 2019-12-25 DIAGNOSIS — Z87891 Personal history of nicotine dependence: Secondary | ICD-10-CM | POA: Diagnosis not present

## 2019-12-25 DIAGNOSIS — Z7982 Long term (current) use of aspirin: Secondary | ICD-10-CM | POA: Diagnosis not present

## 2019-12-25 DIAGNOSIS — Z9981 Dependence on supplemental oxygen: Secondary | ICD-10-CM | POA: Diagnosis not present

## 2019-12-25 DIAGNOSIS — J432 Centrilobular emphysema: Secondary | ICD-10-CM | POA: Diagnosis not present

## 2019-12-25 DIAGNOSIS — I252 Old myocardial infarction: Secondary | ICD-10-CM | POA: Diagnosis not present

## 2019-12-25 DIAGNOSIS — Z9181 History of falling: Secondary | ICD-10-CM | POA: Diagnosis not present

## 2019-12-25 DIAGNOSIS — I700 Atherosclerosis of aorta: Secondary | ICD-10-CM | POA: Diagnosis not present

## 2019-12-25 DIAGNOSIS — E039 Hypothyroidism, unspecified: Secondary | ICD-10-CM | POA: Diagnosis not present

## 2019-12-25 DIAGNOSIS — I2510 Atherosclerotic heart disease of native coronary artery without angina pectoris: Secondary | ICD-10-CM | POA: Diagnosis not present

## 2020-01-02 DIAGNOSIS — Z87891 Personal history of nicotine dependence: Secondary | ICD-10-CM | POA: Diagnosis not present

## 2020-01-02 DIAGNOSIS — E039 Hypothyroidism, unspecified: Secondary | ICD-10-CM | POA: Diagnosis not present

## 2020-01-02 DIAGNOSIS — I219 Acute myocardial infarction, unspecified: Secondary | ICD-10-CM | POA: Diagnosis not present

## 2020-01-02 DIAGNOSIS — I252 Old myocardial infarction: Secondary | ICD-10-CM | POA: Diagnosis not present

## 2020-01-02 DIAGNOSIS — J432 Centrilobular emphysema: Secondary | ICD-10-CM | POA: Diagnosis not present

## 2020-01-02 DIAGNOSIS — Z7951 Long term (current) use of inhaled steroids: Secondary | ICD-10-CM | POA: Diagnosis not present

## 2020-01-02 DIAGNOSIS — Z7982 Long term (current) use of aspirin: Secondary | ICD-10-CM | POA: Diagnosis not present

## 2020-01-02 DIAGNOSIS — Z951 Presence of aortocoronary bypass graft: Secondary | ICD-10-CM | POA: Diagnosis not present

## 2020-01-02 DIAGNOSIS — I5022 Chronic systolic (congestive) heart failure: Secondary | ICD-10-CM | POA: Diagnosis not present

## 2020-01-02 DIAGNOSIS — I2510 Atherosclerotic heart disease of native coronary artery without angina pectoris: Secondary | ICD-10-CM | POA: Diagnosis not present

## 2020-01-02 DIAGNOSIS — I110 Hypertensive heart disease with heart failure: Secondary | ICD-10-CM | POA: Diagnosis not present

## 2020-01-02 DIAGNOSIS — I700 Atherosclerosis of aorta: Secondary | ICD-10-CM | POA: Diagnosis not present

## 2020-01-02 DIAGNOSIS — Z9981 Dependence on supplemental oxygen: Secondary | ICD-10-CM | POA: Diagnosis not present

## 2020-01-02 DIAGNOSIS — Z9181 History of falling: Secondary | ICD-10-CM | POA: Diagnosis not present

## 2020-01-23 DIAGNOSIS — I2510 Atherosclerotic heart disease of native coronary artery without angina pectoris: Secondary | ICD-10-CM | POA: Diagnosis not present

## 2020-01-23 DIAGNOSIS — I10 Essential (primary) hypertension: Secondary | ICD-10-CM | POA: Diagnosis not present

## 2020-01-23 DIAGNOSIS — I5043 Acute on chronic combined systolic (congestive) and diastolic (congestive) heart failure: Secondary | ICD-10-CM | POA: Diagnosis not present

## 2020-01-23 DIAGNOSIS — E039 Hypothyroidism, unspecified: Secondary | ICD-10-CM | POA: Diagnosis not present

## 2020-01-23 DIAGNOSIS — J441 Chronic obstructive pulmonary disease with (acute) exacerbation: Secondary | ICD-10-CM | POA: Diagnosis not present

## 2020-01-23 DIAGNOSIS — D539 Nutritional anemia, unspecified: Secondary | ICD-10-CM | POA: Diagnosis not present

## 2020-01-28 DIAGNOSIS — Z20822 Contact with and (suspected) exposure to covid-19: Secondary | ICD-10-CM | POA: Diagnosis not present

## 2020-01-30 DIAGNOSIS — Z20822 Contact with and (suspected) exposure to covid-19: Secondary | ICD-10-CM | POA: Diagnosis not present

## 2020-02-23 DIAGNOSIS — J441 Chronic obstructive pulmonary disease with (acute) exacerbation: Secondary | ICD-10-CM | POA: Diagnosis not present

## 2020-02-23 DIAGNOSIS — I5043 Acute on chronic combined systolic (congestive) and diastolic (congestive) heart failure: Secondary | ICD-10-CM | POA: Diagnosis not present

## 2020-03-18 DIAGNOSIS — J811 Chronic pulmonary edema: Secondary | ICD-10-CM | POA: Diagnosis not present

## 2020-03-18 DIAGNOSIS — Z743 Need for continuous supervision: Secondary | ICD-10-CM | POA: Diagnosis not present

## 2020-03-18 DIAGNOSIS — Z87891 Personal history of nicotine dependence: Secondary | ICD-10-CM | POA: Diagnosis not present

## 2020-03-18 DIAGNOSIS — J9621 Acute and chronic respiratory failure with hypoxia: Secondary | ICD-10-CM | POA: Diagnosis not present

## 2020-03-18 DIAGNOSIS — I340 Nonrheumatic mitral (valve) insufficiency: Secondary | ICD-10-CM | POA: Diagnosis not present

## 2020-03-18 DIAGNOSIS — R0603 Acute respiratory distress: Secondary | ICD-10-CM | POA: Diagnosis not present

## 2020-03-18 DIAGNOSIS — Z515 Encounter for palliative care: Secondary | ICD-10-CM | POA: Diagnosis not present

## 2020-03-18 DIAGNOSIS — I5043 Acute on chronic combined systolic (congestive) and diastolic (congestive) heart failure: Secondary | ICD-10-CM | POA: Diagnosis not present

## 2020-03-18 DIAGNOSIS — I2510 Atherosclerotic heart disease of native coronary artery without angina pectoris: Secondary | ICD-10-CM | POA: Diagnosis not present

## 2020-03-18 DIAGNOSIS — I447 Left bundle-branch block, unspecified: Secondary | ICD-10-CM | POA: Diagnosis not present

## 2020-03-18 DIAGNOSIS — Z66 Do not resuscitate: Secondary | ICD-10-CM | POA: Diagnosis not present

## 2020-03-18 DIAGNOSIS — I5023 Acute on chronic systolic (congestive) heart failure: Secondary | ICD-10-CM | POA: Diagnosis not present

## 2020-03-18 DIAGNOSIS — I361 Nonrheumatic tricuspid (valve) insufficiency: Secondary | ICD-10-CM | POA: Diagnosis not present

## 2020-03-18 DIAGNOSIS — I214 Non-ST elevation (NSTEMI) myocardial infarction: Secondary | ICD-10-CM | POA: Diagnosis not present

## 2020-03-18 DIAGNOSIS — R000 Tachycardia, unspecified: Secondary | ICD-10-CM | POA: Diagnosis not present

## 2020-03-18 DIAGNOSIS — J449 Chronic obstructive pulmonary disease, unspecified: Secondary | ICD-10-CM | POA: Diagnosis not present

## 2020-03-18 DIAGNOSIS — K219 Gastro-esophageal reflux disease without esophagitis: Secondary | ICD-10-CM | POA: Diagnosis not present

## 2020-03-18 DIAGNOSIS — I110 Hypertensive heart disease with heart failure: Secondary | ICD-10-CM | POA: Diagnosis not present

## 2020-03-18 DIAGNOSIS — I25119 Atherosclerotic heart disease of native coronary artery with unspecified angina pectoris: Secondary | ICD-10-CM | POA: Diagnosis not present

## 2020-03-18 DIAGNOSIS — Z79899 Other long term (current) drug therapy: Secondary | ICD-10-CM | POA: Diagnosis not present

## 2020-03-18 DIAGNOSIS — I359 Nonrheumatic aortic valve disorder, unspecified: Secondary | ICD-10-CM | POA: Diagnosis not present

## 2020-03-18 DIAGNOSIS — E785 Hyperlipidemia, unspecified: Secondary | ICD-10-CM | POA: Diagnosis not present

## 2020-03-18 DIAGNOSIS — I160 Hypertensive urgency: Secondary | ICD-10-CM | POA: Diagnosis not present

## 2020-03-18 DIAGNOSIS — I351 Nonrheumatic aortic (valve) insufficiency: Secondary | ICD-10-CM | POA: Diagnosis not present

## 2020-03-18 DIAGNOSIS — E039 Hypothyroidism, unspecified: Secondary | ICD-10-CM | POA: Diagnosis not present

## 2020-03-18 DIAGNOSIS — J9601 Acute respiratory failure with hypoxia: Secondary | ICD-10-CM | POA: Diagnosis not present

## 2020-03-18 DIAGNOSIS — I5021 Acute systolic (congestive) heart failure: Secondary | ICD-10-CM | POA: Diagnosis not present

## 2020-03-18 DIAGNOSIS — I5041 Acute combined systolic (congestive) and diastolic (congestive) heart failure: Secondary | ICD-10-CM | POA: Diagnosis not present

## 2020-03-18 DIAGNOSIS — J441 Chronic obstructive pulmonary disease with (acute) exacerbation: Secondary | ICD-10-CM | POA: Diagnosis not present

## 2020-03-18 DIAGNOSIS — I10 Essential (primary) hypertension: Secondary | ICD-10-CM | POA: Diagnosis not present

## 2020-03-18 DIAGNOSIS — Z880 Allergy status to penicillin: Secondary | ICD-10-CM | POA: Diagnosis not present

## 2020-03-18 DIAGNOSIS — R5381 Other malaise: Secondary | ICD-10-CM | POA: Diagnosis not present

## 2020-03-18 DIAGNOSIS — I499 Cardiac arrhythmia, unspecified: Secondary | ICD-10-CM | POA: Diagnosis not present

## 2020-03-18 DIAGNOSIS — R0602 Shortness of breath: Secondary | ICD-10-CM | POA: Diagnosis not present

## 2020-03-18 DIAGNOSIS — I509 Heart failure, unspecified: Secondary | ICD-10-CM | POA: Diagnosis not present

## 2020-03-18 DIAGNOSIS — R069 Unspecified abnormalities of breathing: Secondary | ICD-10-CM | POA: Diagnosis not present

## 2020-03-19 DIAGNOSIS — I214 Non-ST elevation (NSTEMI) myocardial infarction: Secondary | ICD-10-CM | POA: Diagnosis not present

## 2020-03-19 DIAGNOSIS — I5043 Acute on chronic combined systolic (congestive) and diastolic (congestive) heart failure: Secondary | ICD-10-CM | POA: Diagnosis not present

## 2020-03-19 DIAGNOSIS — I25119 Atherosclerotic heart disease of native coronary artery with unspecified angina pectoris: Secondary | ICD-10-CM | POA: Diagnosis not present

## 2020-03-19 DIAGNOSIS — J9601 Acute respiratory failure with hypoxia: Secondary | ICD-10-CM | POA: Diagnosis not present

## 2020-03-20 DIAGNOSIS — I5041 Acute combined systolic (congestive) and diastolic (congestive) heart failure: Secondary | ICD-10-CM | POA: Diagnosis not present

## 2020-03-21 DIAGNOSIS — I10 Essential (primary) hypertension: Secondary | ICD-10-CM | POA: Diagnosis not present

## 2020-03-21 DIAGNOSIS — I5041 Acute combined systolic (congestive) and diastolic (congestive) heart failure: Secondary | ICD-10-CM | POA: Diagnosis not present

## 2020-03-21 DIAGNOSIS — I25119 Atherosclerotic heart disease of native coronary artery with unspecified angina pectoris: Secondary | ICD-10-CM | POA: Diagnosis not present

## 2020-03-23 DIAGNOSIS — J441 Chronic obstructive pulmonary disease with (acute) exacerbation: Secondary | ICD-10-CM | POA: Diagnosis not present

## 2020-03-23 DIAGNOSIS — J9601 Acute respiratory failure with hypoxia: Secondary | ICD-10-CM | POA: Diagnosis not present

## 2020-03-23 DIAGNOSIS — I214 Non-ST elevation (NSTEMI) myocardial infarction: Secondary | ICD-10-CM | POA: Diagnosis not present

## 2020-03-23 DIAGNOSIS — I5021 Acute systolic (congestive) heart failure: Secondary | ICD-10-CM | POA: Diagnosis not present

## 2020-03-23 DIAGNOSIS — I10 Essential (primary) hypertension: Secondary | ICD-10-CM | POA: Diagnosis not present

## 2020-03-23 DIAGNOSIS — I5043 Acute on chronic combined systolic (congestive) and diastolic (congestive) heart failure: Secondary | ICD-10-CM | POA: Diagnosis not present

## 2020-03-24 DIAGNOSIS — I5023 Acute on chronic systolic (congestive) heart failure: Secondary | ICD-10-CM | POA: Diagnosis not present

## 2020-03-24 DIAGNOSIS — I214 Non-ST elevation (NSTEMI) myocardial infarction: Secondary | ICD-10-CM | POA: Diagnosis not present

## 2020-03-24 DIAGNOSIS — I110 Hypertensive heart disease with heart failure: Secondary | ICD-10-CM | POA: Diagnosis not present

## 2020-03-24 DIAGNOSIS — Z79899 Other long term (current) drug therapy: Secondary | ICD-10-CM | POA: Diagnosis not present

## 2020-03-24 DIAGNOSIS — E785 Hyperlipidemia, unspecified: Secondary | ICD-10-CM | POA: Diagnosis not present

## 2020-03-24 DIAGNOSIS — Z9981 Dependence on supplemental oxygen: Secondary | ICD-10-CM | POA: Diagnosis not present

## 2020-03-24 DIAGNOSIS — I5043 Acute on chronic combined systolic (congestive) and diastolic (congestive) heart failure: Secondary | ICD-10-CM | POA: Diagnosis not present

## 2020-03-24 DIAGNOSIS — J449 Chronic obstructive pulmonary disease, unspecified: Secondary | ICD-10-CM | POA: Diagnosis not present

## 2020-03-24 DIAGNOSIS — J441 Chronic obstructive pulmonary disease with (acute) exacerbation: Secondary | ICD-10-CM | POA: Diagnosis not present

## 2020-03-25 DIAGNOSIS — E785 Hyperlipidemia, unspecified: Secondary | ICD-10-CM | POA: Diagnosis not present

## 2020-03-25 DIAGNOSIS — I5023 Acute on chronic systolic (congestive) heart failure: Secondary | ICD-10-CM | POA: Diagnosis not present

## 2020-03-25 DIAGNOSIS — I110 Hypertensive heart disease with heart failure: Secondary | ICD-10-CM | POA: Diagnosis not present

## 2020-03-25 DIAGNOSIS — I214 Non-ST elevation (NSTEMI) myocardial infarction: Secondary | ICD-10-CM | POA: Diagnosis not present

## 2020-03-25 DIAGNOSIS — Z79899 Other long term (current) drug therapy: Secondary | ICD-10-CM | POA: Diagnosis not present

## 2020-03-25 DIAGNOSIS — Z9981 Dependence on supplemental oxygen: Secondary | ICD-10-CM | POA: Diagnosis not present

## 2020-03-25 DIAGNOSIS — J449 Chronic obstructive pulmonary disease, unspecified: Secondary | ICD-10-CM | POA: Diagnosis not present

## 2020-03-26 DIAGNOSIS — J449 Chronic obstructive pulmonary disease, unspecified: Secondary | ICD-10-CM | POA: Diagnosis not present

## 2020-03-26 DIAGNOSIS — I110 Hypertensive heart disease with heart failure: Secondary | ICD-10-CM | POA: Diagnosis not present

## 2020-03-26 DIAGNOSIS — I5023 Acute on chronic systolic (congestive) heart failure: Secondary | ICD-10-CM | POA: Diagnosis not present

## 2020-03-26 DIAGNOSIS — I214 Non-ST elevation (NSTEMI) myocardial infarction: Secondary | ICD-10-CM | POA: Diagnosis not present

## 2020-03-26 DIAGNOSIS — Z79899 Other long term (current) drug therapy: Secondary | ICD-10-CM | POA: Diagnosis not present

## 2020-03-26 DIAGNOSIS — Z9981 Dependence on supplemental oxygen: Secondary | ICD-10-CM | POA: Diagnosis not present

## 2020-03-26 DIAGNOSIS — E785 Hyperlipidemia, unspecified: Secondary | ICD-10-CM | POA: Diagnosis not present

## 2020-03-26 DIAGNOSIS — I5032 Chronic diastolic (congestive) heart failure: Secondary | ICD-10-CM | POA: Diagnosis not present

## 2020-03-30 DIAGNOSIS — J449 Chronic obstructive pulmonary disease, unspecified: Secondary | ICD-10-CM | POA: Diagnosis not present

## 2020-03-30 DIAGNOSIS — I110 Hypertensive heart disease with heart failure: Secondary | ICD-10-CM | POA: Diagnosis not present

## 2020-03-30 DIAGNOSIS — I214 Non-ST elevation (NSTEMI) myocardial infarction: Secondary | ICD-10-CM | POA: Diagnosis not present

## 2020-03-30 DIAGNOSIS — Z79899 Other long term (current) drug therapy: Secondary | ICD-10-CM | POA: Diagnosis not present

## 2020-03-30 DIAGNOSIS — E785 Hyperlipidemia, unspecified: Secondary | ICD-10-CM | POA: Diagnosis not present

## 2020-03-30 DIAGNOSIS — I5023 Acute on chronic systolic (congestive) heart failure: Secondary | ICD-10-CM | POA: Diagnosis not present

## 2020-03-30 DIAGNOSIS — Z9981 Dependence on supplemental oxygen: Secondary | ICD-10-CM | POA: Diagnosis not present

## 2020-04-01 DIAGNOSIS — J449 Chronic obstructive pulmonary disease, unspecified: Secondary | ICD-10-CM | POA: Diagnosis not present

## 2020-04-01 DIAGNOSIS — I5022 Chronic systolic (congestive) heart failure: Secondary | ICD-10-CM | POA: Diagnosis not present

## 2020-04-01 DIAGNOSIS — Z09 Encounter for follow-up examination after completed treatment for conditions other than malignant neoplasm: Secondary | ICD-10-CM | POA: Diagnosis not present

## 2020-04-01 DIAGNOSIS — I252 Old myocardial infarction: Secondary | ICD-10-CM | POA: Diagnosis not present

## 2020-04-01 DIAGNOSIS — I10 Essential (primary) hypertension: Secondary | ICD-10-CM | POA: Diagnosis not present

## 2020-04-02 DIAGNOSIS — I2510 Atherosclerotic heart disease of native coronary artery without angina pectoris: Secondary | ICD-10-CM | POA: Diagnosis not present

## 2020-04-02 DIAGNOSIS — R5381 Other malaise: Secondary | ICD-10-CM | POA: Diagnosis not present

## 2020-04-02 DIAGNOSIS — I5022 Chronic systolic (congestive) heart failure: Secondary | ICD-10-CM | POA: Diagnosis not present

## 2020-04-02 DIAGNOSIS — I110 Hypertensive heart disease with heart failure: Secondary | ICD-10-CM | POA: Diagnosis not present

## 2020-04-02 DIAGNOSIS — E785 Hyperlipidemia, unspecified: Secondary | ICD-10-CM | POA: Diagnosis not present

## 2020-04-02 DIAGNOSIS — J449 Chronic obstructive pulmonary disease, unspecified: Secondary | ICD-10-CM | POA: Diagnosis not present

## 2020-04-02 DIAGNOSIS — I214 Non-ST elevation (NSTEMI) myocardial infarction: Secondary | ICD-10-CM | POA: Diagnosis not present

## 2020-04-02 DIAGNOSIS — I5023 Acute on chronic systolic (congestive) heart failure: Secondary | ICD-10-CM | POA: Diagnosis not present

## 2020-04-02 DIAGNOSIS — Z79899 Other long term (current) drug therapy: Secondary | ICD-10-CM | POA: Diagnosis not present

## 2020-04-02 DIAGNOSIS — Z9981 Dependence on supplemental oxygen: Secondary | ICD-10-CM | POA: Diagnosis not present

## 2020-04-06 DIAGNOSIS — J449 Chronic obstructive pulmonary disease, unspecified: Secondary | ICD-10-CM | POA: Diagnosis not present

## 2020-04-06 DIAGNOSIS — I5023 Acute on chronic systolic (congestive) heart failure: Secondary | ICD-10-CM | POA: Diagnosis not present

## 2020-04-06 DIAGNOSIS — I214 Non-ST elevation (NSTEMI) myocardial infarction: Secondary | ICD-10-CM | POA: Diagnosis not present

## 2020-04-06 DIAGNOSIS — E785 Hyperlipidemia, unspecified: Secondary | ICD-10-CM | POA: Diagnosis not present

## 2020-04-06 DIAGNOSIS — Z79899 Other long term (current) drug therapy: Secondary | ICD-10-CM | POA: Diagnosis not present

## 2020-04-06 DIAGNOSIS — I110 Hypertensive heart disease with heart failure: Secondary | ICD-10-CM | POA: Diagnosis not present

## 2020-04-06 DIAGNOSIS — Z9981 Dependence on supplemental oxygen: Secondary | ICD-10-CM | POA: Diagnosis not present

## 2020-04-15 DIAGNOSIS — Z9981 Dependence on supplemental oxygen: Secondary | ICD-10-CM | POA: Diagnosis not present

## 2020-04-15 DIAGNOSIS — I214 Non-ST elevation (NSTEMI) myocardial infarction: Secondary | ICD-10-CM | POA: Diagnosis not present

## 2020-04-15 DIAGNOSIS — J449 Chronic obstructive pulmonary disease, unspecified: Secondary | ICD-10-CM | POA: Diagnosis not present

## 2020-04-15 DIAGNOSIS — E785 Hyperlipidemia, unspecified: Secondary | ICD-10-CM | POA: Diagnosis not present

## 2020-04-15 DIAGNOSIS — Z79899 Other long term (current) drug therapy: Secondary | ICD-10-CM | POA: Diagnosis not present

## 2020-04-15 DIAGNOSIS — I110 Hypertensive heart disease with heart failure: Secondary | ICD-10-CM | POA: Diagnosis not present

## 2020-04-15 DIAGNOSIS — I5023 Acute on chronic systolic (congestive) heart failure: Secondary | ICD-10-CM | POA: Diagnosis not present

## 2020-04-19 DIAGNOSIS — I2510 Atherosclerotic heart disease of native coronary artery without angina pectoris: Secondary | ICD-10-CM | POA: Diagnosis not present

## 2020-04-19 DIAGNOSIS — D539 Nutritional anemia, unspecified: Secondary | ICD-10-CM | POA: Diagnosis not present

## 2020-04-19 DIAGNOSIS — E039 Hypothyroidism, unspecified: Secondary | ICD-10-CM | POA: Diagnosis not present

## 2020-04-21 DIAGNOSIS — I214 Non-ST elevation (NSTEMI) myocardial infarction: Secondary | ICD-10-CM | POA: Diagnosis not present

## 2020-04-21 DIAGNOSIS — J449 Chronic obstructive pulmonary disease, unspecified: Secondary | ICD-10-CM | POA: Diagnosis not present

## 2020-04-21 DIAGNOSIS — Z9981 Dependence on supplemental oxygen: Secondary | ICD-10-CM | POA: Diagnosis not present

## 2020-04-21 DIAGNOSIS — Z79899 Other long term (current) drug therapy: Secondary | ICD-10-CM | POA: Diagnosis not present

## 2020-04-21 DIAGNOSIS — I5023 Acute on chronic systolic (congestive) heart failure: Secondary | ICD-10-CM | POA: Diagnosis not present

## 2020-04-21 DIAGNOSIS — E785 Hyperlipidemia, unspecified: Secondary | ICD-10-CM | POA: Diagnosis not present

## 2020-04-21 DIAGNOSIS — I110 Hypertensive heart disease with heart failure: Secondary | ICD-10-CM | POA: Diagnosis not present

## 2020-04-23 DIAGNOSIS — I110 Hypertensive heart disease with heart failure: Secondary | ICD-10-CM | POA: Diagnosis not present

## 2020-04-23 DIAGNOSIS — I10 Essential (primary) hypertension: Secondary | ICD-10-CM | POA: Diagnosis not present

## 2020-04-23 DIAGNOSIS — E039 Hypothyroidism, unspecified: Secondary | ICD-10-CM | POA: Diagnosis not present

## 2020-04-23 DIAGNOSIS — Z681 Body mass index (BMI) 19 or less, adult: Secondary | ICD-10-CM | POA: Diagnosis not present

## 2020-04-23 DIAGNOSIS — Z79899 Other long term (current) drug therapy: Secondary | ICD-10-CM | POA: Diagnosis not present

## 2020-04-23 DIAGNOSIS — I5023 Acute on chronic systolic (congestive) heart failure: Secondary | ICD-10-CM | POA: Diagnosis not present

## 2020-04-23 DIAGNOSIS — R636 Underweight: Secondary | ICD-10-CM | POA: Diagnosis not present

## 2020-04-23 DIAGNOSIS — Z9981 Dependence on supplemental oxygen: Secondary | ICD-10-CM | POA: Diagnosis not present

## 2020-04-23 DIAGNOSIS — E785 Hyperlipidemia, unspecified: Secondary | ICD-10-CM | POA: Diagnosis not present

## 2020-04-23 DIAGNOSIS — I214 Non-ST elevation (NSTEMI) myocardial infarction: Secondary | ICD-10-CM | POA: Diagnosis not present

## 2020-04-23 DIAGNOSIS — J449 Chronic obstructive pulmonary disease, unspecified: Secondary | ICD-10-CM | POA: Diagnosis not present

## 2020-04-24 DIAGNOSIS — I5043 Acute on chronic combined systolic (congestive) and diastolic (congestive) heart failure: Secondary | ICD-10-CM | POA: Diagnosis not present

## 2020-04-24 DIAGNOSIS — J441 Chronic obstructive pulmonary disease with (acute) exacerbation: Secondary | ICD-10-CM | POA: Diagnosis not present

## 2020-04-26 DIAGNOSIS — I5032 Chronic diastolic (congestive) heart failure: Secondary | ICD-10-CM | POA: Diagnosis not present

## 2020-04-26 DIAGNOSIS — J449 Chronic obstructive pulmonary disease, unspecified: Secondary | ICD-10-CM | POA: Diagnosis not present

## 2020-04-27 DIAGNOSIS — J449 Chronic obstructive pulmonary disease, unspecified: Secondary | ICD-10-CM | POA: Diagnosis not present

## 2020-04-27 DIAGNOSIS — I214 Non-ST elevation (NSTEMI) myocardial infarction: Secondary | ICD-10-CM | POA: Diagnosis not present

## 2020-04-27 DIAGNOSIS — I110 Hypertensive heart disease with heart failure: Secondary | ICD-10-CM | POA: Diagnosis not present

## 2020-04-27 DIAGNOSIS — Z79899 Other long term (current) drug therapy: Secondary | ICD-10-CM | POA: Diagnosis not present

## 2020-04-27 DIAGNOSIS — Z9981 Dependence on supplemental oxygen: Secondary | ICD-10-CM | POA: Diagnosis not present

## 2020-04-27 DIAGNOSIS — I5023 Acute on chronic systolic (congestive) heart failure: Secondary | ICD-10-CM | POA: Diagnosis not present

## 2020-04-27 DIAGNOSIS — E785 Hyperlipidemia, unspecified: Secondary | ICD-10-CM | POA: Diagnosis not present

## 2020-04-29 DIAGNOSIS — I10 Essential (primary) hypertension: Secondary | ICD-10-CM | POA: Diagnosis not present

## 2020-04-29 DIAGNOSIS — I5022 Chronic systolic (congestive) heart failure: Secondary | ICD-10-CM | POA: Diagnosis not present

## 2020-04-29 DIAGNOSIS — E785 Hyperlipidemia, unspecified: Secondary | ICD-10-CM | POA: Diagnosis not present

## 2020-04-29 DIAGNOSIS — J410 Simple chronic bronchitis: Secondary | ICD-10-CM | POA: Diagnosis not present

## 2020-04-29 DIAGNOSIS — I25119 Atherosclerotic heart disease of native coronary artery with unspecified angina pectoris: Secondary | ICD-10-CM | POA: Diagnosis not present

## 2020-05-04 ENCOUNTER — Telehealth: Payer: Self-pay | Admitting: Internal Medicine

## 2020-05-04 DIAGNOSIS — R5381 Other malaise: Secondary | ICD-10-CM | POA: Diagnosis not present

## 2020-05-04 DIAGNOSIS — I2510 Atherosclerotic heart disease of native coronary artery without angina pectoris: Secondary | ICD-10-CM | POA: Diagnosis not present

## 2020-05-04 DIAGNOSIS — I110 Hypertensive heart disease with heart failure: Secondary | ICD-10-CM | POA: Diagnosis not present

## 2020-05-04 DIAGNOSIS — J449 Chronic obstructive pulmonary disease, unspecified: Secondary | ICD-10-CM | POA: Diagnosis not present

## 2020-05-04 DIAGNOSIS — I5022 Chronic systolic (congestive) heart failure: Secondary | ICD-10-CM | POA: Diagnosis not present

## 2020-05-05 DIAGNOSIS — J449 Chronic obstructive pulmonary disease, unspecified: Secondary | ICD-10-CM | POA: Diagnosis not present

## 2020-05-05 DIAGNOSIS — Z9981 Dependence on supplemental oxygen: Secondary | ICD-10-CM | POA: Diagnosis not present

## 2020-05-05 DIAGNOSIS — I110 Hypertensive heart disease with heart failure: Secondary | ICD-10-CM | POA: Diagnosis not present

## 2020-05-05 DIAGNOSIS — Z79899 Other long term (current) drug therapy: Secondary | ICD-10-CM | POA: Diagnosis not present

## 2020-05-05 DIAGNOSIS — E785 Hyperlipidemia, unspecified: Secondary | ICD-10-CM | POA: Diagnosis not present

## 2020-05-05 DIAGNOSIS — I5023 Acute on chronic systolic (congestive) heart failure: Secondary | ICD-10-CM | POA: Diagnosis not present

## 2020-05-05 DIAGNOSIS — I214 Non-ST elevation (NSTEMI) myocardial infarction: Secondary | ICD-10-CM | POA: Diagnosis not present

## 2020-05-11 NOTE — Telephone Encounter (Signed)
error 

## 2020-05-12 DIAGNOSIS — I214 Non-ST elevation (NSTEMI) myocardial infarction: Secondary | ICD-10-CM | POA: Diagnosis not present

## 2020-05-12 DIAGNOSIS — I5023 Acute on chronic systolic (congestive) heart failure: Secondary | ICD-10-CM | POA: Diagnosis not present

## 2020-05-12 DIAGNOSIS — Z9981 Dependence on supplemental oxygen: Secondary | ICD-10-CM | POA: Diagnosis not present

## 2020-05-12 DIAGNOSIS — J449 Chronic obstructive pulmonary disease, unspecified: Secondary | ICD-10-CM | POA: Diagnosis not present

## 2020-05-12 DIAGNOSIS — E785 Hyperlipidemia, unspecified: Secondary | ICD-10-CM | POA: Diagnosis not present

## 2020-05-12 DIAGNOSIS — Z79899 Other long term (current) drug therapy: Secondary | ICD-10-CM | POA: Diagnosis not present

## 2020-05-12 DIAGNOSIS — I110 Hypertensive heart disease with heart failure: Secondary | ICD-10-CM | POA: Diagnosis not present

## 2020-05-24 DIAGNOSIS — I5043 Acute on chronic combined systolic (congestive) and diastolic (congestive) heart failure: Secondary | ICD-10-CM | POA: Diagnosis not present

## 2020-05-24 DIAGNOSIS — J441 Chronic obstructive pulmonary disease with (acute) exacerbation: Secondary | ICD-10-CM | POA: Diagnosis not present

## 2020-05-26 DIAGNOSIS — I5032 Chronic diastolic (congestive) heart failure: Secondary | ICD-10-CM | POA: Diagnosis not present

## 2020-05-26 DIAGNOSIS — J449 Chronic obstructive pulmonary disease, unspecified: Secondary | ICD-10-CM | POA: Diagnosis not present

## 2020-06-07 DIAGNOSIS — I5022 Chronic systolic (congestive) heart failure: Secondary | ICD-10-CM | POA: Diagnosis not present

## 2020-06-07 DIAGNOSIS — R5381 Other malaise: Secondary | ICD-10-CM | POA: Diagnosis not present

## 2020-06-07 DIAGNOSIS — I110 Hypertensive heart disease with heart failure: Secondary | ICD-10-CM | POA: Diagnosis not present

## 2020-06-07 DIAGNOSIS — J449 Chronic obstructive pulmonary disease, unspecified: Secondary | ICD-10-CM | POA: Diagnosis not present

## 2020-06-07 DIAGNOSIS — I2510 Atherosclerotic heart disease of native coronary artery without angina pectoris: Secondary | ICD-10-CM | POA: Diagnosis not present
